# Patient Record
Sex: Female | Born: 1980 | Race: White | Hispanic: No | Marital: Single | Smoking: Never smoker
Health system: Southern US, Community
[De-identification: ages and names within clinical notes are randomized; demographics above are authoritative.]

## PROBLEM LIST (undated history)

## (undated) DIAGNOSIS — K219 Gastro-esophageal reflux disease without esophagitis: Secondary | ICD-10-CM

## (undated) DIAGNOSIS — A630 Anogenital (venereal) warts: Secondary | ICD-10-CM

## (undated) DIAGNOSIS — I499 Cardiac arrhythmia, unspecified: Secondary | ICD-10-CM

## (undated) DIAGNOSIS — E785 Hyperlipidemia, unspecified: Secondary | ICD-10-CM

## (undated) DIAGNOSIS — I10 Essential (primary) hypertension: Secondary | ICD-10-CM

## (undated) DIAGNOSIS — K589 Irritable bowel syndrome without diarrhea: Secondary | ICD-10-CM

## (undated) DIAGNOSIS — F419 Anxiety disorder, unspecified: Secondary | ICD-10-CM

## (undated) DIAGNOSIS — R112 Nausea with vomiting, unspecified: Secondary | ICD-10-CM

## (undated) DIAGNOSIS — Z9889 Other specified postprocedural states: Secondary | ICD-10-CM

## (undated) HISTORY — PX: APPENDECTOMY: SHX54

## (undated) HISTORY — DX: Anxiety disorder, unspecified: F41.9

## (undated) HISTORY — DX: Essential (primary) hypertension: I10

## (undated) HISTORY — DX: Anogenital (venereal) warts: A63.0

## (undated) HISTORY — DX: Hyperlipidemia, unspecified: E78.5

## (undated) HISTORY — PX: WISDOM TOOTH EXTRACTION: SHX21

---

## 2009-01-12 LAB — HM DIABETES FOOT EXAM: HM Diabetic Foot Exam: NORMAL

## 2011-01-24 ENCOUNTER — Encounter (HOSPITAL_COMMUNITY): Payer: Self-pay | Admitting: *Deleted

## 2011-01-24 ENCOUNTER — Emergency Department (HOSPITAL_COMMUNITY)
Admission: EM | Admit: 2011-01-24 | Discharge: 2011-01-24 | Disposition: A | Discharge status: Home / Self Care | Payer: 59 | Attending: Emergency Medicine | Admitting: Emergency Medicine

## 2011-01-24 ENCOUNTER — Emergency Department (HOSPITAL_COMMUNITY): Payer: 59

## 2011-01-24 DIAGNOSIS — R630 Anorexia: Secondary | ICD-10-CM | POA: Insufficient documentation

## 2011-01-24 DIAGNOSIS — R112 Nausea with vomiting, unspecified: Secondary | ICD-10-CM | POA: Insufficient documentation

## 2011-01-24 DIAGNOSIS — R1033 Periumbilical pain: Secondary | ICD-10-CM | POA: Insufficient documentation

## 2011-01-24 DIAGNOSIS — R197 Diarrhea, unspecified: Secondary | ICD-10-CM | POA: Insufficient documentation

## 2011-01-24 DIAGNOSIS — N949 Unspecified condition associated with female genital organs and menstrual cycle: Secondary | ICD-10-CM | POA: Insufficient documentation

## 2011-01-24 LAB — CBC
Platelets: 190 10*3/uL (ref 150–400)
RBC: 5.55 MIL/uL — ABNORMAL HIGH (ref 3.87–5.11)
RDW: 12.5 % (ref 11.5–15.5)
WBC: 10.7 10*3/uL — ABNORMAL HIGH (ref 4.0–10.5)

## 2011-01-24 LAB — URINALYSIS, ROUTINE W REFLEX MICROSCOPIC
Bilirubin Urine: NEGATIVE
Glucose, UA: NEGATIVE mg/dL
Hgb urine dipstick: NEGATIVE
Ketones, ur: NEGATIVE mg/dL
Leukocytes, UA: NEGATIVE
Nitrite: NEGATIVE
Urobilinogen, UA: 0.2 mg/dL (ref 0.0–1.0)

## 2011-01-24 LAB — DIFFERENTIAL
Basophils Absolute: 0 10*3/uL (ref 0.0–0.1)
Lymphocytes Relative: 14 % (ref 12–46)
Lymphs Abs: 1.4 10*3/uL (ref 0.7–4.0)
Neutro Abs: 8.5 10*3/uL — ABNORMAL HIGH (ref 1.7–7.7)

## 2011-01-24 LAB — COMPREHENSIVE METABOLIC PANEL
ALT: 11 U/L (ref 0–35)
AST: 14 U/L (ref 0–37)
Alkaline Phosphatase: 84 U/L (ref 39–117)
CO2: 24 mEq/L (ref 19–32)
Calcium: 9.1 mg/dL (ref 8.4–10.5)
Chloride: 107 mEq/L (ref 96–112)
GFR calc non Af Amer: 88 mL/min — ABNORMAL LOW (ref >90)
Potassium: 3.8 mEq/L (ref 3.5–5.1)
Sodium: 141 mEq/L (ref 135–145)
Total Bilirubin: 0.3 mg/dL (ref 0.3–1.2)

## 2011-01-24 MED ORDER — IOHEXOL 300 MG/ML  SOLN
100.0000 mL | Freq: Once | INTRAMUSCULAR | Status: AC | PRN
  Administered 2011-01-24: 100 mL via INTRAVENOUS

## 2011-01-24 MED ORDER — HYDROCODONE-ACETAMINOPHEN 5-325 MG PO TABS: 2.0000 | ORAL_TABLET | ORAL | Status: AC | PRN

## 2011-01-24 MED ORDER — IOHEXOL 300 MG/ML  SOLN
40.0000 mL | Freq: Once | INTRAMUSCULAR | Status: AC | PRN
  Administered 2011-01-24: 40 mL via ORAL

## 2011-01-24 MED ORDER — MORPHINE SULFATE 4 MG/ML IJ SOLN
4.0000 mg | Freq: Once | INTRAMUSCULAR | Status: AC
  Administered 2011-01-24: 4 mg via INTRAVENOUS
  Filled 2011-01-24: qty 1

## 2011-01-24 MED ORDER — ONDANSETRON HCL 4 MG/2ML IJ SOLN
4.0000 mg | Freq: Once | INTRAMUSCULAR | Status: AC
  Administered 2011-01-24: 4 mg via INTRAVENOUS

## 2011-01-24 MED ORDER — ONDANSETRON HCL 4 MG/2ML IJ SOLN
4.0000 mg | Freq: Once | INTRAMUSCULAR | Status: AC
  Administered 2011-01-24: 4 mg via INTRAVENOUS
  Filled 2011-01-24: qty 2

## 2011-01-24 MED ORDER — SODIUM CHLORIDE 0.9 % IV BOLUS (SEPSIS)
1000.0000 mL | Freq: Once | INTRAVENOUS | Status: AC
  Administered 2011-01-24: 1000 mL via INTRAVENOUS

## 2011-01-24 MED ORDER — PROMETHAZINE HCL 25 MG PO TABS: 25.0000 mg | ORAL_TABLET | Freq: Four times a day (QID) | ORAL | Status: DC | PRN

## 2011-01-24 MED ORDER — ONDANSETRON HCL 4 MG/2ML IJ SOLN
INTRAMUSCULAR | Status: AC
  Filled 2011-01-24: qty 2

## 2011-01-24 NOTE — ED Notes (Signed)
Pt A&O x4, follows commands, speaks in complete sentences, calm & cooperative, pt c/o nausea, pt rcvd 4mg  Zofran, pt encouraged to drink CT contrast, NAD

## 2011-01-24 NOTE — ED Notes (Signed)
Pt seen by EDPA prior to RN assessment, see PA notes, orders received and intiated.

## 2011-01-24 NOTE — ED Notes (Signed)
C/o abd pain, onset 0315, some nausea, "feel urge to have BM", also diarrhea 1 hr ago, with regular BM 20 minutes prior to that, (denies: vomiting, fever, bleeding, urinary or vaginal sx, dizziness or other sx). No meds PTA.

## 2011-01-24 NOTE — ED Notes (Signed)
Pt to ED c/o acute onset abd pain.  Pt has been experiencing cramping or diarrhea and a feeling of fullness every time after she eats since August.  Wed she exp more diarrhea than normal and she took immodium.  She had not had a bm since Wed and tonight she exp epigastric and lower abd intermittent cramping, accompanied by diarrhea with "pellets".  Pain increases if pt lays flat.  Denies fevers.

## 2011-01-24 NOTE — ED Notes (Signed)
Pt unable to provide stool sample.  No diarrhea noted since arrived to CDU.

## 2011-01-24 NOTE — ED Notes (Signed)
Medical screening examination/treatment/procedure(s) were conducted as a shared visit with non-physician practitioner(s) and myself.  I personally evaluated the patient during the encounter   HPI: 31 year old female with no prior abdominal surgery history presents with pain in the midabdomen. She describes it as a sharp and stabbing pain that comes on intermittently. Last for approximately 10 minutes and then resolve spontaneously. She had a diarrhea illness last week, took Imodium 3 days ago and had resolution of her diarrhea. She did not have another bowel movement until approximately 2 and half hours ago when she was awoken with midabdominal pain, had a large bulky bowel movement followed by very loose stools. The pain has not been relieved despite these actions. She denies dysuria, fever, vomiting but does have mild nausea.   Physical exam:  Patient appears in no acute distress, conjunctiva are clear, mucous membranes moist, neck is supple, no lymphadenopathy. Lungs are clear bilaterally, heart sounds are regular without murmurs. No tachycardia. Abdomen is soft, no tenderness in the right lower quadrant, mild midabdominal tenderness but no guarding, non-peritoneal.  Assessed:  Labs, imaging, reevaluate. Pain medication ordered   Change of shift - care signed out to Dr. Hyman Hopes at 8 AM  Vida Roller, MD 01/24/11 [phone]

## 2011-01-24 NOTE — ED Notes (Signed)
Pt appeared quite anxious while SL ws being placed.  After IV placed, pt stated she couldn't breath and she was feeling flushed.  PA Peter notified, pt placed on monitor and given instructions to deep breath.  VS stable.

## 2011-01-24 NOTE — ED Provider Notes (Signed)
  Physical Exam  BP 115/70  Pulse 93  Temp(Src) 97.4 F (36.3 C) (Oral)  Resp 18  SpO2 100%  LMP 01/16/2011  Physical Exam  ED Course  Procedures  Patient still having some mild pain around her umbilicus.  The motor to the CDU for further evaluation and care.     Carlyle Dolly, PA-C 01/24/11 1433

## 2011-01-24 NOTE — ED Provider Notes (Signed)
Patient report received from Ebbie Ridge, PA-C. 31 year old female presents ED with chief complaints of periumbilical abdominal pain with associated nausea vomiting and diarrhea. Patient has been evaluated. Her lab works unremarkable. Abdominal CT scan shows evidence of appendicolith but without evidence of appendicitis. Her pain has improved with pain medication. Patient has been moved to CDU for symptomatic treatment. Patient can be discharged if she is able to tolerate by mouth. Patient was given strict followup instructions.  12:34 PM Patient continues to endorse periumbilical abdominal pain. She has no appetite. She feels nauseated but states Zofran and morphine seems to make the pain worse. She just had a bout of diarrhea. We'll continue watchful waiting.  Questionable early onset appy.   2:01 PM Patient endorse improvement in her symptoms. The nausea has decreased. I instructed for patient to return to the ED in the next 24 hours if her symptoms are not resolving at home. We discussed the possibility of early onset appendicitis. If the symptoms improves, I will give patient referral to  followup outpatient for further management. Patient voiced understanding and agreement with plan.  Fayrene Helper, PA-C 01/24/11 1519  Fayrene Helper, PA-C 01/28/11 [phone]

## 2011-01-24 NOTE — ED Notes (Signed)
Pt s/s improved.   

## 2011-01-24 NOTE — ED Provider Notes (Signed)
History     CSN: [id number]  Arrival date & time 01/24/11  0455   None     Chief Complaint  Patient presents with  . Abdominal Pain     HPI  History provided by the patient. Patient is a 31 year old female with no significant past medical history who presents with complaints of acute onset generalized and periumbilical abdominal pains that began and awoke her from sleep around 3:00 this morning. Patient reports having some symptoms of diarrhea recently for which she took Imodium. Her symptoms had improved. Patient actually states that for the past several months she has noticed having loose stools after she eats anything. This morning after patient awoke with abdominal pain she does report having 1 large regular type bowel movement followed shortly after by a second loose watery diarrhea type bowel movement. She denies having any blood or mucus in her stool. Patient denies having any recent fever, chills, sweats or vomiting. She does have associated nausea. Patient recently finished normal menstrual period and denies any gynecological complaints. She denies any urinary complaints or symptoms. No urinary frequency, dysuria, hematuria. Patient has no significant past surgical history.    History reviewed. No pertinent past medical history.  Past Surgical History  Procedure Date  . Wisdom tooth extraction     Family History  Problem Relation Age of Onset  . Adopted: Yes    History  Substance Use Topics  . Smoking status: Never Smoker   . Smokeless tobacco: Not on file  . Alcohol Use: No    OB History    Grav Para Term Preterm Abortions TAB SAB Ect Mult Living                  Review of Systems  Constitutional: Negative for fever and chills.  Respiratory: Negative for cough and shortness of breath.   Cardiovascular: Negative for chest pain.  Gastrointestinal: Positive for nausea, abdominal pain and diarrhea. Negative for vomiting, constipation, blood in stool, abdominal  distention, anal bleeding and rectal pain.  Genitourinary: Positive for pelvic pain. Negative for dysuria, frequency, hematuria, flank pain, vaginal bleeding, vaginal discharge and menstrual problem.  All other systems reviewed and are negative.    Allergies  Review of patient's allergies indicates no known allergies.  Home Medications   Current Outpatient Rx  Name Route Sig Dispense Refill  . LOPERAMIDE HCL 2 MG PO CAPS Oral Take 2 mg by mouth once.      LMP 01/16/2011  Physical Exam  Nursing note and vitals reviewed. Constitutional: She is oriented to person, place, and time. She appears well-developed and well-nourished. No distress.  HENT:  Head: Normocephalic.  Neck: Normal range of motion. Neck supple.       No meningeal sign  Cardiovascular: Normal rate and regular rhythm.   No murmur heard. Pulmonary/Chest: Effort normal and breath sounds normal. No respiratory distress. She has no wheezes. She has no rales.  Abdominal: Soft. There is tenderness in the periumbilical area. There is rebound. There is no guarding, no CVA tenderness, no tenderness at McBurney's point and negative Murphy's sign.       Mild diffuse tenderness.  Neurological: She is alert and oriented to person, place, and time.  Skin: Skin is warm and dry. No rash noted.  Psychiatric: She has a normal mood and affect. Her behavior is normal.    ED Course  Procedures (including critical care time)   Labs Reviewed  CBC  DIFFERENTIAL  COMPREHENSIVE METABOLIC PANEL  LIPASE,  BLOOD  URINALYSIS, ROUTINE W REFLEX MICROSCOPIC  POCT PREGNANCY, URINE   Results for orders placed during the hospital encounter of 01/24/11  CBC      Component Value Range   WBC 10.7 (*) 4.0 - 10.5 (K/uL)   RBC 5.55 (*) 3.87 - 5.11 (MIL/uL)   Hemoglobin 15.1 (*) 12.0 - 15.0 (g/dL)   HCT 82.9  56.2 - 13.0 (%)   MCV 79.5  78.0 - 100.0 (fL)   MCH 27.2  26.0 - 34.0 (pg)   MCHC 34.2  30.0 - 36.0 (g/dL)   RDW 86.5  78.4 - 69.6 (%)    Platelets 190  150 - 400 (K/uL)  DIFFERENTIAL      Component Value Range   Neutrophils Relative 79 (*) 43 - 77 (%)   Neutro Abs 8.5 (*) 1.7 - 7.7 (K/uL)   Lymphocytes Relative 14  12 - 46 (%)   Lymphs Abs 1.4  0.7 - 4.0 (K/uL)   Monocytes Relative 7  3 - 12 (%)   Monocytes Absolute 0.7  0.1 - 1.0 (K/uL)   Eosinophils Relative 1  0 - 5 (%)   Eosinophils Absolute 0.1  0.0 - 0.7 (K/uL)   Basophils Relative 0  0 - 1 (%)   Basophils Absolute 0.0  0.0 - 0.1 (K/uL)  COMPREHENSIVE METABOLIC PANEL      Component Value Range   Sodium 141  135 - 145 (mEq/L)   Potassium 3.8  3.5 - 5.1 (mEq/L)   Chloride 107  96 - 112 (mEq/L)   CO2 24  19 - 32 (mEq/L)   Glucose, Bld 101 (*) 70 - 99 (mg/dL)   BUN 15  6 - 23 (mg/dL)   Creatinine, Ser 2.95  0.50 - 1.10 (mg/dL)   Calcium 9.1  8.4 - 28.4 (mg/dL)   Total Protein 7.6  6.0 - 8.3 (g/dL)   Albumin 3.9  3.5 - 5.2 (g/dL)   AST 14  0 - 37 (U/L)   ALT 11  0 - 35 (U/L)   Alkaline Phosphatase 84  39 - 117 (U/L)   Total Bilirubin 0.3  0.3 - 1.2 (mg/dL)   GFR calc non Af Amer 88 (*) >90 (mL/min)   GFR calc Af Amer >90  >90 (mL/min)  LIPASE, BLOOD      Component Value Range   Lipase 29  11 - 59 (U/L)  URINALYSIS, ROUTINE W REFLEX MICROSCOPIC      Component Value Range   Color, Urine YELLOW  YELLOW    APPearance CLOUDY (*) CLEAR    Specific Gravity, Urine 1.022  1.005 - 1.030    pH 5.5  5.0 - 8.0    Glucose, UA NEGATIVE  NEGATIVE (mg/dL)   Hgb urine dipstick NEGATIVE  NEGATIVE    Bilirubin Urine NEGATIVE  NEGATIVE    Ketones, ur NEGATIVE  NEGATIVE (mg/dL)   Protein, ur NEGATIVE  NEGATIVE (mg/dL)   Urobilinogen, UA 0.2  0.0 - 1.0 (mg/dL)   Nitrite NEGATIVE  NEGATIVE    Leukocytes, UA NEGATIVE  NEGATIVE   POCT PREGNANCY, URINE      Component Value Range   Preg Test, Ur NEGATIVE      Ct Abdomen Pelvis W Contrast  01/24/2011  *RADIOLOGY REPORT*  Clinical Data: Umbilical pain since 3:00 a.m. this morning. Diarrhea.  CT ABDOMEN AND PELVIS WITH  CONTRAST  Technique:  Multidetector CT imaging of the abdomen and pelvis was performed following the standard protocol during bolus administration of intravenous contrast.  Contrast: OMNIPAQUE IOHEXOL 300 MG/ML IV SOLN  Comparison: None.  Findings: There is a 9 mm appendicolith within the mid aspect of the appendix.  The rest of the appendix is normal in caliber containing air and a thin wall.  No periappendiceal inflammatory changes are seen. There is no CT evidence of acute appendicitis.  The bowel is unremarkable.  There is no bowel wall thickening or inflammatory changes.  Clear lung bases.  The heart is normal in size.  The liver, spleen, gallbladder and pancreas are unremarkable.  There is no bile duct dilation.  There are no adrenal masses.  The right kidney is under- rotated and a tiny presumed cyst arises from its medial mid to lower pole.  The kidneys are otherwise unremarkable.  Normal ureters.  Normal bladder.  Normal uterus and adnexa.  Trace, presumed physiologic, free fluid is noted in the cul-de-sac.  No adenopathy.  The bony structures are unremarkable.  IMPRESSION: No evidence of appendicitis although there is a 9 mm mid appendix appendicolith.  The exam is otherwise unremarkable.  No bowel inflammatory changes are evident.  Original Report Authenticated By: Domenic Moras, M.D.     1. Abdominal pain   2. Nausea, vomiting and diarrhea       MDM  5:00 AM patient seen and evaluated. Patient in no acute distress. Patient does appear slightly uncomfortable. Initial workup started with labs urinalysis and CT scan to evaluate for possible appendicitis versus colitis.  6:00AM  Pt discussed in signout with Ebbie Ridge PAC.  He will await labs and x-ray to make dispo.      Angus Seller, [place] 01/24/11 2004

## 2011-01-25 NOTE — ED Provider Notes (Signed)
Medical screening examination/treatment/procedure(s) were conducted as a shared visit with non-physician practitioner(s) and myself.  I personally evaluated the patient during the encounter Medical screening examination/treatment/procedure(s) were conducted as a shared visit with non-physician practitioner(s) and myself.  I personally evaluated the patient during the encounter  HPI: 31 year old female with no prior abdominal surgery history presents with pain in the midabdomen. She describes it as a sharp and stabbing pain that comes on intermittently. Last for approximately 10 minutes and then resolve spontaneously. She had a diarrhea illness last week, took Imodium 3 days ago and had resolution of her diarrhea. She did not have another bowel movement until approximately 2 and half hours ago when she was awoken with midabdominal pain, had a large bulky bowel movement followed by very loose stools. The pain has not been relieved despite these actions. She denies dysuria, fever, vomiting but does have mild nausea.   Physical exam: Patient appears in no acute distress, conjunctiva are clear, mucous membranes moist, neck is supple, no lymphadenopathy. Lungs are clear bilaterally, heart sounds are regular without murmurs. No tachycardia. Abdomen is soft, no tenderness in the right lower quadrant, mild midabdominal tenderness but no guarding, non-peritoneal.   Assessed: Labs, imaging, reevaluate. Pain medication ordered   See separate documentation as well   Vida Roller, MD 01/25/11 [phone]

## 2011-01-25 NOTE — ED Provider Notes (Signed)
Medical screening examination/treatment/procedure(s) were conducted as a shared visit with non-physician practitioner(s) and myself.  I personally evaluated the patient during the encounter  Medical screening examination/treatment/procedure(s) were conducted as a shared visit with non-physician practitioner(s) and myself. I personally evaluated the patient during the encounter   HPI: 31 year old female with no prior abdominal surgery history presents with pain in the midabdomen. She describes it as a sharp and stabbing pain that comes on intermittently. Last for approximately 10 minutes and then resolve spontaneously. She had a diarrhea illness last week, took Imodium 3 days ago and had resolution of her diarrhea. She did not have another bowel movement until approximately 2 and half hours ago when she was awoken with midabdominal pain, had a large bulky bowel movement followed by very loose stools. The pain has not been relieved despite these actions. She denies dysuria, fever, vomiting but does have mild nausea.   Physical exam: Patient appears in no acute distress, conjunctiva are clear, mucous membranes moist, neck is supple, no lymphadenopathy. Lungs are clear bilaterally, heart sounds are regular without murmurs. No tachycardia. Abdomen is soft, no tenderness in the right lower quadrant, mild midabdominal tenderness but no guarding, non-peritoneal.    Assessed: Labs, imaging, reevaluate. Pain medication ordered  Change of shift - care signed out to Dr. Hyman Hopes at 8 AM   Vida Roller, MD 01/25/11 [phone]

## 2011-02-02 NOTE — ED Provider Notes (Signed)
Medical screening examination/treatment/procedure(s) were performed by non-physician practitioner and as supervising physician I was immediately available for consultation/collaboration.   Vida Roller, MD 02/02/11 [phone]

## 2011-03-02 ENCOUNTER — Emergency Department (HOSPITAL_COMMUNITY)
Admission: EM | Admit: 2011-03-02 | Discharge: 2011-03-03 | Disposition: A | Discharge status: Home / Self Care | Payer: 59 | Attending: Emergency Medicine | Admitting: Emergency Medicine

## 2011-03-02 ENCOUNTER — Encounter (HOSPITAL_COMMUNITY): Payer: Self-pay | Admitting: Emergency Medicine

## 2011-03-02 DIAGNOSIS — D72829 Elevated white blood cell count, unspecified: Secondary | ICD-10-CM | POA: Insufficient documentation

## 2011-03-02 DIAGNOSIS — R112 Nausea with vomiting, unspecified: Secondary | ICD-10-CM | POA: Insufficient documentation

## 2011-03-02 DIAGNOSIS — R109 Unspecified abdominal pain: Secondary | ICD-10-CM | POA: Insufficient documentation

## 2011-03-02 LAB — COMPREHENSIVE METABOLIC PANEL
Albumin: 3.9 g/dL (ref 3.5–5.2)
Alkaline Phosphatase: 78 U/L (ref 39–117)
BUN: 9 mg/dL (ref 6–23)
Potassium: 3.8 mEq/L (ref 3.5–5.1)
Sodium: 136 mEq/L (ref 135–145)
Total Protein: 7.7 g/dL (ref 6.0–8.3)

## 2011-03-02 LAB — URINALYSIS, ROUTINE W REFLEX MICROSCOPIC
Glucose, UA: NEGATIVE mg/dL
Ketones, ur: 15 mg/dL — AB
Protein, ur: NEGATIVE mg/dL
Urobilinogen, UA: 0.2 mg/dL (ref 0.0–1.0)

## 2011-03-02 LAB — POCT PREGNANCY, URINE: Preg Test, Ur: NEGATIVE

## 2011-03-02 LAB — URINE MICROSCOPIC-ADD ON

## 2011-03-02 LAB — CBC
HCT: 45.1 % (ref 36.0–46.0)
Hemoglobin: 15.2 g/dL — ABNORMAL HIGH (ref 12.0–15.0)
MCH: 26.3 pg (ref 26.0–34.0)
MCV: 77.9 fL — ABNORMAL LOW (ref 78.0–100.0)
RBC: 5.79 MIL/uL — ABNORMAL HIGH (ref 3.87–5.11)

## 2011-03-02 LAB — DIFFERENTIAL
Basophils Absolute: 0 10*3/uL (ref 0.0–0.1)
Basophils Relative: 0 % (ref 0–1)
Eosinophils Absolute: 0 10*3/uL (ref 0.0–0.7)
Eosinophils Relative: 0 % (ref 0–5)
Metamyelocytes Relative: 0 %
Myelocytes: 0 %
Neutro Abs: 14.6 10*3/uL — ABNORMAL HIGH (ref 1.7–7.7)

## 2011-03-02 LAB — LIPASE, BLOOD: Lipase: 23 U/L (ref 11–59)

## 2011-03-02 MED ORDER — ONDANSETRON 4 MG PO TBDP: 8.0000 mg | ORAL_TABLET | Freq: Once | ORAL | Status: DC

## 2011-03-02 MED ORDER — PANTOPRAZOLE SODIUM 40 MG IV SOLR
40.0000 mg | Freq: Once | INTRAVENOUS | Status: DC
  Filled 2011-03-02: qty 40

## 2011-03-02 MED ORDER — ONDANSETRON HCL 8 MG PO TABS
ORAL_TABLET | ORAL | Status: AC
  Administered 2011-03-02: 8 mg
  Filled 2011-03-02: qty 1

## 2011-03-02 MED ORDER — METOCLOPRAMIDE HCL 5 MG/ML IJ SOLN
10.0000 mg | Freq: Once | INTRAMUSCULAR | Status: DC
  Filled 2011-03-02: qty 2

## 2011-03-02 NOTE — ED Notes (Signed)
Patient presents with c/o mid abd pain, lower back pain that started earlier today.  Stated she was here for the same thing about 1 month ago and was dx with N/V.  Currently on her period.  Has a history of diarrhea and constipation.

## 2011-03-02 NOTE — ED Notes (Signed)
Pt c/o mid abd pain radiating to lower back, onset this am with nausea and vomiting.

## 2011-03-02 NOTE — ED Provider Notes (Signed)
History     CSN: [id number]  Arrival date & time 03/02/11  1840   First MD Initiated Contact with Patient 03/02/11 2017      Chief Complaint  Patient presents with  . Abdominal Pain    (Consider location/radiation/quality/duration/timing/severity/associated sxs/prior treatment) Patient is a 31 y.o. [name] presenting with abdominal pain. The history is provided by the patient.  Abdominal Pain The primary symptoms of the illness include abdominal pain, nausea, vomiting and diarrhea. The primary symptoms of the illness do not include fever, hematochezia or dysuria. Episode onset: She has had intermittent aabdominal pain for the past one month.  The pain came on gradually. The abdominal pain is located in the periumbilical region. The abdominal pain does not radiate. The abdominal pain is relieved by nothing. The abdominal pain is exacerbated by eating.  The diarrhea is semi-solid. The diarrhea occurs 2 to 4 times per day.  Symptoms associated with the illness do not include chills. Associated symptoms comments: She was seen over one month ago here and reports a negative CT scan. She returns with persistent symptoms. .    History reviewed. No pertinent past medical history.  Past Surgical History  Procedure Date  . Wisdom tooth extraction     Family History  Problem Relation Age of Onset  . Adopted: Yes    History  Substance Use Topics  . Smoking status: Never Smoker   . Smokeless tobacco: Not on file  . Alcohol Use: No    OB History    Grav Para Term Preterm Abortions TAB SAB Ect Mult Living                  Review of Systems  Constitutional: Negative for fever and chills.  HENT: Negative.   Respiratory: Negative.   Cardiovascular: Negative.   Gastrointestinal: Positive for nausea, vomiting, abdominal pain and diarrhea. Negative for hematochezia.  Genitourinary: Negative for dysuria.  Musculoskeletal: Negative.   Skin: Negative.   Neurological: Negative.      Allergies  Vicodin  Home Medications   Current Outpatient Rx  Name Route Sig Dispense Refill  . ACETAMINOPHEN 500 MG PO TABS Oral Take 500 mg by mouth daily as needed. For pain    . DICYCLOMINE HCL 20 MG PO TABS Oral Take 20 mg by mouth 4 (four) times daily as needed. For abdominal cramping    . DOCUSATE SODIUM 100 MG PO CAPS Oral Take 100 mg by mouth daily.    Marland Kitchen LOPERAMIDE HCL 1 MG/5ML PO LIQD Oral Take 6 mg by mouth daily as needed.    Marland Kitchen PROMETHAZINE HCL 25 MG PO TABS Oral Take 25 mg by mouth daily as needed. For nausea      BP 122/74  Pulse 59  Temp(Src) Kristin.8 F (36.6 C) (Oral)  Resp 16  SpO2 98%  LMP 03/02/2011  Physical Exam  Constitutional: She appears well-developed and well-nourished.  HENT:  Head: Normocephalic.  Neck: Normal range of motion. Neck supple.  Cardiovascular: Normal rate and regular rhythm.   Pulmonary/Chest: Effort normal and breath sounds normal.  Abdominal: Soft. Bowel sounds are normal. There is tenderness. There is no rebound and no guarding.       Mild periumbilical tenderness without guarding or rebound.  Musculoskeletal: Normal range of motion.  Neurological: She is alert. No cranial nerve deficit.  Skin: Skin is warm and dry. No rash noted.  Psychiatric: She has a normal mood and affect.    ED Course  Procedures (including critical care  time)  Labs Reviewed  CBC - Abnormal; Notable for the following:    RBC 5.79 (*)    Hemoglobin 15.2 (*)    MCV 77.9 (*)    All other components within normal limits  COMPREHENSIVE METABOLIC PANEL - Abnormal; Notable for the following:    Glucose, Bld 102 (*)    All other components within normal limits  DIFFERENTIAL  LIPASE, BLOOD  URINALYSIS, ROUTINE W REFLEX MICROSCOPIC   No results found.   No diagnosis found.    MDM  Review CT scan 01-25-11 - negative. Patient with continued abdominal pain and now leukocytosis. Will obtain ultrasound to evaluate gall bladder.         Rodena Medin, PA-C 03/03/11 [phone]

## 2011-03-03 ENCOUNTER — Emergency Department (HOSPITAL_COMMUNITY): Payer: 59

## 2011-03-03 MED ORDER — OXYCODONE-ACETAMINOPHEN 5-325 MG PO TABS: 1.0000 | ORAL_TABLET | Freq: Four times a day (QID) | ORAL | Status: AC | PRN

## 2011-03-03 NOTE — ED Provider Notes (Signed)
Results reviewed. The patient has a leukocytosis. CT performed on 01/25/11 reviewed. Unremarkable. Ultrasound was negative for acute process. Patient states she's feeling much better and wishes to be discharged home. I explained that she continues to have symptoms in the next couple of days with worsening she is to return for further evaluation. I also instructed her to followup with her primary care physician this week. She is provided a prescription for pain medication. On reassessment her abdominal examination was unremarkable therefore do not feel a repeat CT scan is indicated at this time  Dayton Bailiff, MD 03/03/11 [phone]

## 2011-03-03 NOTE — Discharge Instructions (Signed)

## 2011-03-04 NOTE — ED Provider Notes (Signed)
Medical screening examination/treatment/procedure(s) were performed by non-physician practitioner and as supervising physician I was immediately available for consultation/collaboration.  Ethelda Chick, MD 03/04/11 1120

## 2011-03-06 ENCOUNTER — Other Ambulatory Visit (HOSPITAL_COMMUNITY): Payer: Self-pay | Admitting: Internal Medicine

## 2011-03-06 DIAGNOSIS — R1011 Right upper quadrant pain: Secondary | ICD-10-CM

## 2011-03-16 ENCOUNTER — Encounter (HOSPITAL_COMMUNITY)
Admission: RE | Admit: 2011-03-16 | Discharge: 2011-03-16 | Disposition: A | Discharge status: Home / Self Care | Payer: 59 | Source: Ambulatory Visit | Attending: Internal Medicine | Admitting: Internal Medicine

## 2011-03-16 DIAGNOSIS — R1011 Right upper quadrant pain: Secondary | ICD-10-CM | POA: Insufficient documentation

## 2011-03-16 MED ORDER — SINCALIDE 5 MCG IJ SOLR
INTRAMUSCULAR | Status: AC
  Administered 2011-03-16: 1.7 ug
  Filled 2011-03-16: qty 5

## 2011-03-16 MED ORDER — TECHNETIUM TC 99M MEBROFENIN IV KIT
5.0000 | PACK | Freq: Once | INTRAVENOUS | Status: AC | PRN
  Administered 2011-03-16: 5 via INTRAVENOUS

## 2011-07-10 ENCOUNTER — Emergency Department (HOSPITAL_COMMUNITY)
Admission: EM | Admit: 2011-07-10 | Discharge: 2011-07-10 | Disposition: A | Discharge status: Home / Self Care | Payer: 59 | Source: Home / Self Care | Attending: Family Medicine | Admitting: Family Medicine

## 2011-07-10 ENCOUNTER — Encounter (HOSPITAL_COMMUNITY): Payer: Self-pay

## 2011-07-10 DIAGNOSIS — F411 Generalized anxiety disorder: Secondary | ICD-10-CM

## 2011-07-10 DIAGNOSIS — F419 Anxiety disorder, unspecified: Secondary | ICD-10-CM

## 2011-07-10 HISTORY — DX: Irritable bowel syndrome, unspecified: K58.9

## 2011-07-10 HISTORY — DX: Gastro-esophageal reflux disease without esophagitis: K21.9

## 2011-07-10 MED ORDER — HYDROXYZINE HCL 50 MG PO TABS: 50.0000 mg | ORAL_TABLET | Freq: Three times a day (TID) | ORAL | Status: DC | PRN

## 2011-07-10 NOTE — ED Provider Notes (Signed)
History     CSN: [id number]  Arrival date & time 07/10/11  1447   First MD Initiated Contact with Patient 07/10/11 1606      Chief Complaint  Patient presents with  . Chest Pain  . Breathing Problem    (Consider location/radiation/quality/duration/timing/severity/associated sxs/prior treatment) HPI Comments: 31 year old female with a history of irritable bowel syndrome, anxiety episodes in the past. Here complaining of episodes of difficulty breathing described as shallow fast breathing associated with dizziness and chest discomfort. Episodes occurring more frequently in the last 2 days. Denies pain radiation to the arms neck or jaw, no diaphoresis. Also patient reports having some flareup of her IBS symptoms characterized by nausea diarrhea and abdominal cramps. IBS symptoms improved with dicyclomine , probiotics, Phenergan and Imodium.  Patient reports she is currently stressed about work, house and money issues. Not sleeping well and feels worried all the time. Has a followup appointment with the [facility] to establish a primary care in the next few weeks. Has never taken antidepressants or anxiolytic medications in the past. Denies cough or congestion. No fever. No headaches or visual changes. Denies current chest pain, headache nausea or abdominal pain. Denies suicidal or homicidal ideation.    Past Medical History  Diagnosis Date  . IBS (irritable bowel syndrome)   . GERD (gastroesophageal reflux disease)     Past Surgical History  Procedure Date  . Wisdom tooth extraction     Family History  Problem Relation Age of Onset  . Adopted: Yes    History  Substance Use Topics  . Smoking status: Never Smoker   . Smokeless tobacco: Not on file  . Alcohol Use: No    OB History    Grav Para Term Preterm Abortions TAB SAB Ect Mult Living                  Review of Systems  Constitutional: Negative for fever, chills, diaphoresis, appetite change, fatigue and unexpected  weight change.  HENT: Negative for congestion, sore throat, trouble swallowing and neck pain.   Respiratory: Positive for chest tightness. Negative for cough, wheezing and stridor.        As per HPI  Cardiovascular: Positive for chest pain. Negative for palpitations and leg swelling.  Gastrointestinal: Positive for nausea and diarrhea. Negative for vomiting.  Genitourinary: Negative for dysuria and frequency.  Skin: Negative for rash.  Neurological: Positive for dizziness. Negative for tremors, seizures, syncope, weakness and headaches.  Psychiatric/Behavioral: Positive for disturbed wake/sleep cycle. Negative for suicidal ideas, hallucinations, confusion, self-injury, dysphoric mood and agitation. The patient is nervous/anxious. The patient is not hyperactive.     Allergies  Morphine and related and Vicodin  Home Medications   Current Outpatient Rx  Name Route Sig Dispense Refill  . MULTIVITAMIN PO Oral Take by mouth.    Marland Kitchen PROBIOTIC DAILY PO Oral Take by mouth.    . BENEFIBER PO Oral Take by mouth.    . ACETAMINOPHEN 500 MG PO TABS Oral Take 500 mg by mouth daily as needed. For pain    . DICYCLOMINE HCL 20 MG PO TABS Oral Take 20 mg by mouth 4 (four) times daily as needed. For abdominal cramping    . DOCUSATE SODIUM 100 MG PO CAPS Oral Take 100 mg by mouth daily.    Marland Kitchen HYDROXYZINE HCL 50 MG PO TABS Oral Take 1 tablet (50 mg total) by mouth every 8 (eight) hours as needed for anxiety. 30 tablet 0  . LOPERAMIDE HCL 1  MG/5ML PO LIQD Oral Take 6 mg by mouth daily as needed.    Marland Kitchen PROMETHAZINE HCL 25 MG PO TABS Oral Take 1 tablet (25 mg total) by mouth every 6 (six) hours as needed for nausea. 20 tablet 0  . PROMETHAZINE HCL 25 MG PO TABS Oral Take 25 mg by mouth daily as needed. For nausea      BP 149/79  Pulse 66  Temp 98.1 F (36.7 C) (Oral)  Resp 16  SpO2 100%  LMP 07/03/2011  Physical Exam  Nursing note and vitals reviewed. Constitutional: She is oriented to person, place, and  time. She appears well-developed and well-nourished. No distress.  HENT:  Head: Normocephalic and atraumatic.  Mouth/Throat: No oropharyngeal exudate.  Eyes: Conjunctivae and EOM are normal. Pupils are equal, round, and reactive to light. No scleral icterus.  Neck: Normal range of motion. Neck supple. No JVD present.       Thyroid impress full.  Cardiovascular: Normal rate, regular rhythm, normal heart sounds and intact distal pulses.  Exam reveals no gallop and no friction rub.   No murmur heard. Pulmonary/Chest: Effort normal and breath sounds normal. No respiratory distress. She has no wheezes. She has no rales. She exhibits no tenderness.  Lymphadenopathy:    She has no cervical adenopathy.  Neurological: She is alert and oriented to person, place, and time.  Skin: No rash noted. She is not diaphoretic.  Psychiatric: She has a normal mood and affect. Her behavior is normal. Judgment and thought content normal.    ED Course  Procedures (including critical care time)  Labs Reviewed - No data to display No results found.   1. Anxiety       MDM  EKG: normal sinus rhythm. Rate 60's. Impress few isolated PAC's. No fascicular, BBB or AV blocks. No ST or other acute ischemic changes.  H/o anxiety and panic attacks in the past. Prescribed hydroxyzine, discussed with patient stress relieving strategies including daily walk brakes, breathing exercises and bed side note book to purge worries and develop next day to do list before sleep time. Patient has a new patient appointment soon at PCP office. Reccommended to discuss symptoms and possible thyroid work up, anxiety treatment and cardiology and/or behavioral health referral. Patient agreeable.         Sharin Grave, MD 07/11/11 1316

## 2011-07-10 NOTE — ED Notes (Signed)
Pt states she has been having chest tightness and shallow breathing for 2 days- states it may be stress.  States Sat thru Monday she had diarrhea and felt lightheaded and dizzy on Tues and Wednesday.

## 2011-07-10 NOTE — Discharge Instructions (Signed)
Your EKG (electrical heart activity test) does not show any signs of heart attack and is essentially normal other than few early beats as we discussed does not necessary constitute disease. You can talk about this test results with your primary care provider and discuss the need for any further cardiology evaluation as outpatient. You could also benefit from checking your thyroid function with your primary care doctor. Take the prescribed medications as instructed. Go to the emergency department if persistent or worsening symptoms despite following treatment. Otherwise followup with your primary care provider as as scheduled.

## 2011-07-17 ENCOUNTER — Ambulatory Visit: Payer: 59 | Admitting: Internal Medicine

## 2011-07-20 ENCOUNTER — Ambulatory Visit (INDEPENDENT_AMBULATORY_CARE_PROVIDER_SITE_OTHER): Payer: 59 | Admitting: Internal Medicine

## 2011-07-20 ENCOUNTER — Encounter: Payer: Self-pay | Admitting: Internal Medicine

## 2011-07-20 VITALS — BP 130/82 | HR 69 | Temp 97.9°F | Ht 65.0 in | Wt 188.1 lb

## 2011-07-20 DIAGNOSIS — F419 Anxiety disorder, unspecified: Secondary | ICD-10-CM

## 2011-07-20 DIAGNOSIS — F411 Generalized anxiety disorder: Secondary | ICD-10-CM

## 2011-07-20 DIAGNOSIS — Z0000 Encounter for general adult medical examination without abnormal findings: Secondary | ICD-10-CM

## 2011-07-20 DIAGNOSIS — K219 Gastro-esophageal reflux disease without esophagitis: Secondary | ICD-10-CM | POA: Insufficient documentation

## 2011-07-20 DIAGNOSIS — K589 Irritable bowel syndrome without diarrhea: Secondary | ICD-10-CM | POA: Insufficient documentation

## 2011-07-20 MED ORDER — ALPRAZOLAM 0.5 MG PO TABS
0.5000 mg | ORAL_TABLET | Freq: Three times a day (TID) | ORAL | Status: AC | PRN
Start: 2011-07-20 — End: 2011-08-19

## 2011-07-20 MED ORDER — RANITIDINE HCL 150 MG PO TABS: 150.0000 mg | ORAL_TABLET | Freq: Two times a day (BID) | ORAL | Status: DC

## 2011-07-20 NOTE — [facility] Notes (Signed)
  Subjective:    Patient ID: Kristin [name], female    DOB: [birth year], 31 y.o.   MRN: [id number]  HPI New pt to me and our practice, here to establish care patient is here today for annual physical. Patient feels well overall.  complains of anxiety and IBS-D Long hx same Worse with stress Not significantly improved with antihistamine (sedation) or probiotics/fiber   Past Medical History  Diagnosis Date  . IBS (irritable bowel syndrome)     diarrhea prone  . GERD (gastroesophageal reflux disease)   . Anxiety    Family History  Problem Relation Age of Onset  . Adopted: Yes  . Family history unknown: Yes   History  Substance Use Topics  . Smoking status: Never Smoker   . Smokeless tobacco: Not on file  . Alcohol Use: No    Review of Systems Constitutional: Negative for fever or weight change.  Respiratory: Negative for cough and shortness of breath.   Cardiovascular: Negative for chest pain or palpitations.  Gastrointestinal: Negative for abdominal pain, no bowel changes. episodic nausea and belching Musculoskeletal: Negative for gait problem or joint swelling.  Skin: Negative for rash.  Neurological: Negative for dizziness or headache.  No other specific complaints in a complete review of systems (except as listed in HPI above).     Objective:   Physical Exam BP 130/82  Pulse 69  Temp 97.9 F (36.6 C) (Oral)  Ht 5\' 5"  (1.651 m)  Wt 188 lb 1.9 oz (85.331 kg)  BMI 31.30 kg/m2  SpO2 98%  LMP 07/03/2011 Wt Readings from Last 3 Encounters:  07/20/11 188 lb 1.9 oz (85.331 kg)  03/16/11 187 lb (84.823 kg)   Constitutional: She is overweight, but appears well-developed and well-nourished. No distress.  HENT: Head: Normocephalic and atraumatic. Ears: B TMs ok, no erythema or effusion; Nose: Nose normal. Mouth/Throat: Oropharynx is clear and moist. No oropharyngeal exudate.  Eyes: Conjunctivae and EOM are normal. Pupils are equal, round, and reactive to light. No  scleral icterus.  Neck: Normal range of motion. Neck supple. No JVD or LAD present. No thyromegaly present.  Cardiovascular: Normal rate, regular rhythm and normal heart sounds.  No murmur heard. No BLE edema. Pulmonary/Chest: Effort normal and breath sounds normal. No respiratory distress. She has no wheezes.  Abdominal: Soft. Bowel sounds are normal. She exhibits no distension. There is no tenderness. no masses Musculoskeletal: Normal range of motion, no joint effusions. No gross deformities Neurological: She is alert and oriented to person, place, and time. No cranial nerve deficit. Coordination normal.  Skin: Skin is warm and dry. No rash noted. No erythema.  Psychiatric: She has an anxious mood and affect. Her behavior is normal. Judgment and thought content normal.   Lab Results  Component Value Date   WBC 17.2* 03/02/2011   HGB 15.2* 03/02/2011   HCT 45.1 03/02/2011   PLT 171 03/02/2011   GLUCOSE 102* 03/02/2011   ALT 11 03/02/2011   AST 16 03/02/2011   NA 136 03/02/2011   K 3.8 03/02/2011   CL 103 03/02/2011   CREATININE 0.71 03/02/2011   BUN 9 03/02/2011   CO2 20 03/02/2011       Assessment & Plan:  CPX/v70.0 - Patient has been counseled on age-appropriate routine health concerns for screening and prevention. These are reviewed and up-to-date. Immunizations are up-to-date or declined. Labs ordered and recent ECG reviewed.

## 2011-07-20 NOTE — Assessment & Plan Note (Signed)
Long hx same Overlap with panic attacks and IBS-D spells Reviewed possible benefit zoloft or paxil Will start with low dose prn xanax for panic attacks Pt to call if frequent use or unimproved symptoms

## 2011-07-20 NOTE — Assessment & Plan Note (Signed)
PPI caused D Try H2B BID x2 weeks, then as needed

## 2011-07-20 NOTE — Patient Instructions (Signed)
It was good to see you today. Health Maintenance reviewed - all recommended immunizations and age-appropriate screenings are up-to-date. Test(s) ordered today. Return when you are fasting -Your results will be called to you after review (48-72hours after test completion). If any changes need to be made, you will be notified at that time. Use Zanatc as discussed for reflux and xanax as needed for anxiety symptoms - Your prescription(s) have been submitted to your pharmacy. Please take as directed and contact our office if you believe you are having problem(s) with the medication(s). Other Medications reviewed, no changes at this time. Work on lifestyle changes as discussed (low fat, low carb, increased protein diet; improved exercise efforts; weight loss) to control sugar, blood pressure and cholesterol levels and/or reduce risk of developing other medical problems. Look into [email or web address] or other type of food journal to assist you in this process. Please schedule followup in 12 months for physical and labs, call sooner if problems.

## 2011-07-21 ENCOUNTER — Other Ambulatory Visit (INDEPENDENT_AMBULATORY_CARE_PROVIDER_SITE_OTHER): Payer: 59

## 2011-07-21 DIAGNOSIS — Z0000 Encounter for general adult medical examination without abnormal findings: Secondary | ICD-10-CM

## 2011-07-21 LAB — URINALYSIS, ROUTINE W REFLEX MICROSCOPIC
Bilirubin Urine: NEGATIVE
Hgb urine dipstick: NEGATIVE
Ketones, ur: NEGATIVE
Leukocytes, UA: NEGATIVE
Nitrite: NEGATIVE
Total Protein, Urine: NEGATIVE

## 2011-07-21 LAB — CBC WITH DIFFERENTIAL/PLATELET
Eosinophils Absolute: 0.1 10*3/uL (ref 0.0–0.7)
HCT: 43.2 % (ref 36.0–46.0)
Lymphs Abs: 2.3 10*3/uL (ref 0.7–4.0)
MCHC: 33 g/dL (ref 30.0–36.0)
MCV: 79.5 fl (ref 78.0–100.0)
Monocytes Absolute: 0.6 10*3/uL (ref 0.1–1.0)
Neutrophils Relative %: 57.7 % (ref 43.0–77.0)
Platelets: 181 10*3/uL (ref 150.0–400.0)

## 2011-07-21 LAB — BASIC METABOLIC PANEL
BUN: 10 mg/dL (ref 6–23)
CO2: 28 mEq/L (ref 19–32)
Chloride: 105 mEq/L (ref 96–112)
Creatinine, Ser: 1 mg/dL (ref 0.4–1.2)
Glucose, Bld: 99 mg/dL (ref 70–99)

## 2011-07-21 LAB — TSH: TSH: 1.89 u[IU]/mL (ref 0.35–5.50)

## 2011-07-21 LAB — LIPID PANEL
Total CHOL/HDL Ratio: 5
VLDL: 40.2 mg/dL — ABNORMAL HIGH (ref 0.0–40.0)

## 2011-07-21 LAB — HEPATIC FUNCTION PANEL
Alkaline Phosphatase: 67 U/L (ref 39–117)
Bilirubin, Direct: 0 mg/dL (ref 0.0–0.3)
Total Bilirubin: 0.7 mg/dL (ref 0.3–1.2)

## 2011-07-22 LAB — LDL CHOLESTEROL, DIRECT: Direct LDL: 137 mg/dL

## 2011-08-16 ENCOUNTER — Emergency Department (HOSPITAL_COMMUNITY): Payer: 59

## 2011-08-16 ENCOUNTER — Emergency Department (HOSPITAL_COMMUNITY)
Admission: EM | Admit: 2011-08-16 | Discharge: 2011-08-16 | Disposition: A | Discharge status: Nursing Home (Includes ICF) | Payer: 59 | Source: Home / Self Care | Attending: Emergency Medicine | Admitting: Emergency Medicine

## 2011-08-16 ENCOUNTER — Emergency Department (INDEPENDENT_AMBULATORY_CARE_PROVIDER_SITE_OTHER): Payer: 59

## 2011-08-16 ENCOUNTER — Encounter (HOSPITAL_COMMUNITY): Payer: Self-pay | Admitting: Emergency Medicine

## 2011-08-16 ENCOUNTER — Emergency Department (HOSPITAL_COMMUNITY)
Admission: EM | Admit: 2011-08-16 | Discharge: 2011-08-16 | Disposition: A | Discharge status: Home / Self Care | Payer: 59 | Attending: Emergency Medicine | Admitting: Emergency Medicine

## 2011-08-16 ENCOUNTER — Other Ambulatory Visit: Payer: Self-pay

## 2011-08-16 ENCOUNTER — Encounter (HOSPITAL_COMMUNITY): Payer: Self-pay

## 2011-08-16 DIAGNOSIS — R079 Chest pain, unspecified: Secondary | ICD-10-CM

## 2011-08-16 DIAGNOSIS — R0602 Shortness of breath: Secondary | ICD-10-CM

## 2011-08-16 DIAGNOSIS — K589 Irritable bowel syndrome without diarrhea: Secondary | ICD-10-CM | POA: Insufficient documentation

## 2011-08-16 DIAGNOSIS — K219 Gastro-esophageal reflux disease without esophagitis: Secondary | ICD-10-CM | POA: Insufficient documentation

## 2011-08-16 DIAGNOSIS — F411 Generalized anxiety disorder: Secondary | ICD-10-CM | POA: Insufficient documentation

## 2011-08-16 LAB — CBC WITH DIFFERENTIAL/PLATELET
Basophils Absolute: 0 10*3/uL (ref 0.0–0.1)
Eosinophils Relative: 1 % (ref 0–5)
HCT: 43.5 % (ref 36.0–46.0)
Lymphocytes Relative: 33 % (ref 12–46)
Lymphs Abs: 2.9 10*3/uL (ref 0.7–4.0)
MCV: 80 fL (ref 78.0–100.0)
Monocytes Absolute: 0.8 10*3/uL (ref 0.1–1.0)
Monocytes Relative: 9 % (ref 3–12)
RDW: 13.4 % (ref 11.5–15.5)
WBC: 8.8 10*3/uL (ref 4.0–10.5)

## 2011-08-16 LAB — BASIC METABOLIC PANEL
BUN: 12 mg/dL (ref 6–23)
CO2: 23 mEq/L (ref 19–32)
Chloride: 106 mEq/L (ref 96–112)
Glucose, Bld: 78 mg/dL (ref 70–99)
Potassium: 4.1 mEq/L (ref 3.5–5.1)

## 2011-08-16 MED ORDER — IOHEXOL 350 MG/ML SOLN
100.0000 mL | Freq: Once | INTRAVENOUS | Status: AC | PRN
  Administered 2011-08-16: 100 mL via INTRAVENOUS

## 2011-08-16 MED ORDER — SODIUM CHLORIDE 0.9 % IV SOLN: Freq: Once | INTRAVENOUS | Status: DC

## 2011-08-16 NOTE — ED Provider Notes (Signed)
Chief Complaint  Patient presents with  . Shortness of Breath    shortness of breath x 3 days.    History of Present Illness:   Kristin [name] is a 31 year old female who has had a four-day history of intermittent episodes of shortness of breath. The first occurred Thursday, 4 days ago while she was walking from the hospital to her car. She suddenly became very short of breath. This would get worse with any exertion. The episode lasted about 24 hours. She denies any coughing, wheezing, or chest pain at that time. Yesterday the same thing happened. She felt like a pressure in her chest. She tried Zyrtec and albuterol without relief. The shortness of breath then went away. Today she doesn't feel very short of breath but has pain that begins in her left shoulder blade and radiates around to her left submammary area. It is not pleuritic. She did not have the chest pain with either of the 2 previous episodes. She denies any fever, chills, URI symptoms, palpitations, dizziness, or syncope. She has no leg pain or swelling and no history of pulmonary embolus or DVT. She denies being nervous or anxious.  Review of Systems:  Other than noted above, the patient denies any of the following symptoms. Systemic:  No fever, chills, sweats, or fatigue. ENT:  No nasal congestion, rhinorrhea, or sore throat. Pulmonary:  No cough, wheezing, shortness of breath, sputum production, hemoptysis. Cardiac:  No palpitations, rapid heartbeat, dizziness, presyncope or syncope. GI:  No abdominal pain, heartburn, nausea, or vomiting. Skin:  No rash or itching. Ext:  No leg pain or swelling.   PMFSH:  Past medical history, family history, social history, meds, and allergies were reviewed and updated as needed.  Physical Exam:   Vital signs:  BP 109/73  Pulse 70  Temp 98.2 F (36.8 C) (Oral)  Resp 18  SpO2 99%  LMP 07/19/2011 Gen:  Alert, oriented, in no distress, skin warm and dry. Eye:  PERRL, lids and conjunctivas normal.   Sclera non-icteric. ENT:  Mucous membranes moist, pharynx clear. Neck:  Supple, no adenopathy or tenderness.  No JVD. Lungs:  Clear to auscultation, no wheezes, rales or rhonchi.  No respiratory distress. Heart:  Regular rhythm.  No gallops, murmers, clicks or rubs. Chest:  No chest wall tenderness. Abdomen:  Soft, nontender, no organomegaly or mass.  Bowel sounds normal.  No pulsatile abdominal mass or bruit. Ext:  No edema.  No calf tenderness and Homann's sign negative.  Pulses full and equal. Skin:  Warm and dry.  No rash.   Radiology:  Dg Chest 2 View  08/16/2011  *RADIOLOGY REPORT*  Clinical Data: Shortness of breath  CHEST - 2 VIEW  Comparison: None.  Findings: Lungs clear.  Heart size and pulmonary vascularity normal.  No effusion.  Visualized bones unremarkable.  IMPRESSION: No acute disease  Original Report Authenticated By: Thora Lance III, M.D.    EKG:   Date: 08/16/2011  Rate: 60  Rhythm: normal sinus rhythm and sinus arrhythmia  QRS Axis: normal  Intervals: normal  ST/T Wave abnormalities: normal  Conduction Disutrbances:none  Narrative Interpretation: Normal sinus rhythm with sinus arrhythmia, normal EKG.  Old EKG Reviewed: No change since previous EKG  Assessment:  The primary encounter diagnosis was Shortness of breath. A diagnosis of Chest pain was also pertinent to this visit. Differential diagnosis includes anxiety, asthma, or pulmonary embolus.   Plan:   1.  The following meds were prescribed:   New Prescriptions  No medications on file   2.  The patient was transported to the emergency department via shuttle.  Reuben Likes, MD 08/16/11 1345

## 2011-08-16 NOTE — ED Notes (Signed)
Pt c/o shortness of breath on and off for 3 days, denies cough or cold symptoms. States was taking zantac for acid reflux for 14 days but stopped. States sob feels like chest is heavy and has started having left lower rib pain one hr ago.

## 2011-08-16 NOTE — ED Provider Notes (Signed)
History     CSN: [id number]  Arrival date & time 08/16/11  1351   First MD Initiated Contact with Patient 08/16/11 1643      Chief Complaint  Patient presents with  . Shortness of Breath  . Chest Pain    left chest    (Consider location/radiation/quality/duration/timing/severity/associated sxs/prior treatment) Patient is a 31 y.o. female presenting with shortness of breath and chest pain. The history is provided by the patient.  Shortness of Breath  Associated symptoms include chest pain and shortness of breath.  Chest Pain Primary symptoms include shortness of breath.    patient here complaining of shortness of breath with pleuritic chest pain localized left-sided chest x4 days. Denies any leg pain or swelling. No hemoptysis. No fever or cough. No prior history of same. Denies use any birth control pills or recent travel history. Was seen in urgent care and sent here for a CAT scan of her chest to rule out pulmonary embolism  Past Medical History  Diagnosis Date  . IBS (irritable bowel syndrome)     diarrhea prone  . GERD (gastroesophageal reflux disease)   . Anxiety     Past Surgical History  Procedure Date  . Wisdom tooth extraction     Family History  Problem Relation Age of Onset  . Adopted: Yes    History  Substance Use Topics  . Smoking status: Never Smoker   . Smokeless tobacco: Not on file  . Alcohol Use: No    OB History    Grav Para Term Preterm Abortions TAB SAB Ect Mult Living                  Review of Systems  Respiratory: Positive for shortness of breath.   Cardiovascular: Positive for chest pain.  All other systems reviewed and are negative.    Allergies  Morphine and related and Vicodin  Home Medications   Current Outpatient Rx  Name Route Sig Dispense Refill  . ALBUTEROL SULFATE HFA 108 (90 BASE) MCG/ACT IN AERS Inhalation Inhale 2 puffs into the lungs every 6 (six) hours as needed.    . ALPRAZOLAM 0.5 MG PO TABS Oral Take 1  tablet (0.5 mg total) by mouth 3 (three) times daily as needed for sleep or anxiety. 30 tablet 0  . CETIRIZINE HCL 10 MG PO TABS Oral Take 5 mg by mouth once.       BP 128/76  Pulse 60  Temp 98.2 F (36.8 C) (Oral)  Resp 18  SpO2 100%  LMP 07/19/2011  Physical Exam  Nursing note and vitals reviewed. Constitutional: She is oriented to person, place, and time. She appears well-developed and well-nourished.  Non-toxic appearance. No distress.  HENT:  Head: Normocephalic and atraumatic.  Eyes: Conjunctivae, EOM and lids are normal. Pupils are equal, round, and reactive to light.  Neck: Normal range of motion. Neck supple. No tracheal deviation present. No mass present.  Cardiovascular: Normal rate, regular rhythm and normal heart sounds.  Exam reveals no gallop.   No murmur heard. Pulmonary/Chest: Effort normal and breath sounds normal. No stridor. No respiratory distress. She has no decreased breath sounds. She has no wheezes. She has no rhonchi. She has no rales.  Abdominal: Soft. Normal appearance and bowel sounds are normal. She exhibits no distension. There is no tenderness. There is no rebound and no CVA tenderness.  Musculoskeletal: Normal range of motion. She exhibits no edema and no tenderness.  Neurological: She is alert and oriented to person,  place, and time. She has normal strength. No cranial nerve deficit or sensory deficit. GCS eye subscore is 4. GCS verbal subscore is 5. GCS motor subscore is 6.  Skin: Skin is warm and dry. No abrasion and no rash noted.  Psychiatric: She has a normal mood and affect. Her speech is normal and behavior is normal.    ED Course  Procedures (including critical care time)  Labs Reviewed  CBC WITH DIFFERENTIAL - Abnormal; Notable for the following:    RBC 5.44 (*)     All other components within normal limits  POCT I-STAT TROPONIN I  BASIC METABOLIC PANEL   Dg Chest 2 View  08/16/2011  *RADIOLOGY REPORT*  Clinical Data: Shortness of  breath  CHEST - 2 VIEW  Comparison: None.  Findings: Lungs clear.  Heart size and pulmonary vascularity normal.  No effusion.  Visualized bones unremarkable.  IMPRESSION: No acute disease  Original Report Authenticated By: Thora Lance III, M.D.     1. Chest pain   2. Shortness of breath      ///////// CDU NOTE: Rhea Bleacher PA-C.   7:24 PM Handoff from Dr. Freida Busman. Patient with left-sided pleuritic CP with intermittent SOB -- presetns from [facility] pending CT angio to rule-out PE. No risk factors. Unknown family history.    Vital signs reviewed and are as follows: Filed Vitals:   08/16/11 1359  BP: 128/76  Pulse: 60  Temp: 98.2 F (36.8 C)  Resp: 18   Patient seen and examined.   7:25 PM Exam:  Gen NAD; Heart RRR, nml S1,S2, no m/r/g; Lungs CTAB; Abd soft, NT, no rebound or guarding; Ext 2+ pedal pulses bilaterally, no edema.  9:44 PM CT reviewed by myself. No PE per radiologist. No other acute findings. Patient informed of results. Urged PCP follow-up in next week. Re-exam: unchanged, no resp distress, no wheezing or SOB.   Patient counseled to return with worsening or changing symptoms.   Patient verbalizes understanding and agrees with plan.   MDM: CP with SOB. CTA of chest does not show PE or other acute findings. EKG performed earlier reviewed by myself and is normal. VSS. Patient appears well during her ED stay. She has PCP follow-up ////////   MDM  Pt sent to cdu to complete w/u        Renne Crigler, PA 08/16/11 2148

## 2011-08-16 NOTE — ED Notes (Signed)
Pt reports left sided chest pain onset today. Pt also reports shortness of breath x 2 days. Pt seen at urgent care and sent here for further eval. Skin w/d color good. Pt talking in complete sentences.

## 2011-08-16 NOTE — ED Notes (Signed)
Patient transported to CT 

## 2011-08-18 NOTE — ED Provider Notes (Signed)
Medical screening examination/treatment/procedure(s) were conducted as a shared visit with non-physician practitioner(s) and myself.  I personally evaluated the patient during the encounter  Toy Baker, MD 08/18/11 [phone]

## 2011-10-03 ENCOUNTER — Emergency Department (HOSPITAL_COMMUNITY)
Admission: EM | Admit: 2011-10-03 | Discharge: 2011-10-03 | Disposition: A | Discharge status: Nursing Home (Includes ICF) | Payer: 59 | Source: Home / Self Care | Attending: Emergency Medicine | Admitting: Emergency Medicine

## 2011-10-03 ENCOUNTER — Encounter (HOSPITAL_COMMUNITY): Payer: Self-pay | Admitting: Emergency Medicine

## 2011-10-03 ENCOUNTER — Encounter (HOSPITAL_COMMUNITY): Payer: Self-pay | Admitting: *Deleted

## 2011-10-03 ENCOUNTER — Inpatient Hospital Stay (HOSPITAL_COMMUNITY)
Admission: EM | Admit: 2011-10-03 | Discharge: 2011-10-06 | DRG: 343 | Disposition: A | Discharge status: Home / Self Care | Payer: 59 | Attending: Surgery | Admitting: Surgery

## 2011-10-03 DIAGNOSIS — K219 Gastro-esophageal reflux disease without esophagitis: Secondary | ICD-10-CM | POA: Diagnosis present

## 2011-10-03 DIAGNOSIS — Z79899 Other long term (current) drug therapy: Secondary | ICD-10-CM

## 2011-10-03 DIAGNOSIS — K589 Irritable bowel syndrome without diarrhea: Secondary | ICD-10-CM | POA: Diagnosis present

## 2011-10-03 DIAGNOSIS — K37 Unspecified appendicitis: Secondary | ICD-10-CM

## 2011-10-03 DIAGNOSIS — K3580 Unspecified acute appendicitis: Principal | ICD-10-CM | POA: Diagnosis present

## 2011-10-03 DIAGNOSIS — F411 Generalized anxiety disorder: Secondary | ICD-10-CM | POA: Diagnosis present

## 2011-10-03 LAB — COMPREHENSIVE METABOLIC PANEL
ALT: 10 U/L (ref 0–35)
Alkaline Phosphatase: 80 U/L (ref 39–117)
BUN: 8 mg/dL (ref 6–23)
CO2: 22 mEq/L (ref 19–32)
GFR calc Af Amer: >90 mL/min (ref >90)
GFR calc non Af Amer: >90 mL/min (ref >90)
Glucose, Bld: 107 mg/dL — ABNORMAL HIGH (ref 70–99)
Potassium: 3.6 mEq/L (ref 3.5–5.1)
Total Bilirubin: 0.2 mg/dL — ABNORMAL LOW (ref 0.3–1.2)
Total Protein: 7.6 g/dL (ref 6.0–8.3)

## 2011-10-03 LAB — CBC WITH DIFFERENTIAL/PLATELET
Eosinophils Absolute: 0.1 10*3/uL (ref 0.0–0.7)
Hemoglobin: 14 g/dL (ref 12.0–15.0)
Lymphocytes Relative: 15 % (ref 12–46)
Lymphs Abs: 2 10*3/uL (ref 0.7–4.0)
MCH: 26.9 pg (ref 26.0–34.0)
MCV: 79.3 fL (ref 78.0–100.0)
Monocytes Relative: 6 % (ref 3–12)
Neutrophils Relative %: 78 % — ABNORMAL HIGH (ref 43–77)
RBC: 5.21 MIL/uL — ABNORMAL HIGH (ref 3.87–5.11)
WBC: 12.7 10*3/uL — ABNORMAL HIGH (ref 4.0–10.5)

## 2011-10-03 LAB — URINALYSIS, ROUTINE W REFLEX MICROSCOPIC
Bilirubin Urine: NEGATIVE
Glucose, UA: NEGATIVE mg/dL
Ketones, ur: NEGATIVE mg/dL
Leukocytes, UA: NEGATIVE
Specific Gravity, Urine: 1.016 (ref 1.005–1.030)
pH: 8 (ref 5.0–8.0)

## 2011-10-03 LAB — POCT URINALYSIS DIP (DEVICE)
Ketones, ur: NEGATIVE mg/dL
Protein, ur: NEGATIVE mg/dL
Specific Gravity, Urine: 1.015 (ref 1.005–1.030)
pH: 7.5 (ref 5.0–8.0)

## 2011-10-03 LAB — LIPASE, BLOOD: Lipase: 32 U/L (ref 11–59)

## 2011-10-03 LAB — WET PREP, GENITAL
Clue Cells Wet Prep HPF POC: NONE SEEN
WBC, Wet Prep HPF POC: NONE SEEN

## 2011-10-03 MED ORDER — IOHEXOL 300 MG/ML  SOLN
20.0000 mL | INTRAMUSCULAR | Status: DC
  Administered 2011-10-03: 20 mL via ORAL

## 2011-10-03 MED ORDER — ONDANSETRON HCL 4 MG/2ML IJ SOLN
4.0000 mg | Freq: Once | INTRAMUSCULAR | Status: DC
  Filled 2011-10-03 (×2): qty 2

## 2011-10-03 MED ORDER — MORPHINE SULFATE 4 MG/ML IJ SOLN
4.0000 mg | Freq: Once | INTRAMUSCULAR | Status: DC
  Filled 2011-10-03: qty 1

## 2011-10-03 NOTE — ED Notes (Signed)
Pt. Refused pain medicine currently, states she will let this nurse know if needed in the future.

## 2011-10-03 NOTE — ED Notes (Signed)
Report to ed by marchella, cma

## 2011-10-03 NOTE — ED Provider Notes (Signed)
Chief Complaint  Patient presents with  . Abdominal Pain    right sided / center naval area    History of Present Illness:    Kristin [name] is a 31 year old female who presents with a 24-hour history of right lower quadrant abdominal pain which radiates towards the epigastrium. The pain began last night and is gradually getting worse, sometimes doubling her over. The pain occurs in waves and is described as sharp and cramping. It radiates through to her lower back and sometimes even into her chest. Severe episodes of pain are associated with nausea. At most the pain as a 10 over 10, right now is down to 6/10 in intensity. The pain is worse if she lies flat or urinates. She denies any burning with urination, frequency, urgency, or hematuria. Her appetite is normal. She's had no fever or vomiting. She has had some diarrhea today with 3-4 loose stools today without blood or mucus. She denies any GYN complaints. Her last menstrual period was September 10. She is sexually active but only with a single female partner.  Review of Systems:  Other than noted above, the patient denies any of the following symptoms: Constitutional:  No fever, chills, fatigue, weight loss or anorexia. Lungs:  No cough or shortness of breath. Heart:  No chest pain, palpitations, syncope or edema.  No cardiac history. Abdomen:  No nausea, vomiting, hematememesis, melena, diarrhea, or hematochezia. GU:  No dysuria, frequency, urgency, or hematuria. Gyn:  No vaginal discharge, itching, abnormal bleeding, dyspareunia, or pelvic pain.  PMFSH:  Past medical history, family history, social history, meds, and allergies were reviewed along with nurse's notes.  No prior abdominal surgeries, past history of GI problems, STDs or GYN problems.  No history of aspirin or NSAID use.  No excessive alcohol intake.  Physical Exam:   Vital signs:  BP 140/86  Pulse 90  Temp 99.3 F (37.4 C) (Oral)  Resp 18  SpO2 98%  LMP 09/29/2011 Gen:  Alert,  oriented, in no distress. Lungs:  Breath sounds clear and equal bilaterally.  No wheezes, rales or rhonchi. Heart:  Regular rhythm.  No gallops or murmers.   Abdomen:  Abdomen is soft, flat, nondistended. There is tenderness to palpation in the right lower quadrant with guarding and rebound. She has a positive Rosvig sign, positive obturator sign, and positive iliopsoas sign. No organomegaly or mass. Bowel sounds are normally active. Skin:  Clear, warm and dry.  No rash.  Labs:   Results for orders placed during the hospital encounter of 10/03/11  POCT URINALYSIS DIP (DEVICE)      Component Value Range   Glucose, UA NEGATIVE  NEGATIVE mg/dL   Bilirubin Urine NEGATIVE  NEGATIVE   Ketones, ur NEGATIVE  NEGATIVE mg/dL   Specific Gravity, Urine 1.015  1.005 - 1.030   Hgb urine dipstick NEGATIVE  NEGATIVE   pH 7.5  5.0 - 8.0   Protein, ur NEGATIVE  NEGATIVE mg/dL   Urobilinogen, UA 0.2  0.0 - 1.0 mg/dL   Nitrite NEGATIVE  NEGATIVE   Leukocytes, UA NEGATIVE  NEGATIVE    Assessment:  The encounter diagnosis was Appendicitis.  Plan:   1.  The following meds were prescribed:   New Prescriptions   No medications on file   2.  The patient was transferred to the emergency department via shuttle.  Reuben Likes, MD 10/03/11 2011

## 2011-10-03 NOTE — ED Notes (Signed)
IV team at bedside 

## 2011-10-03 NOTE — ED Provider Notes (Signed)
History     CSN: [id number]  Arrival date & time 10/03/11  2018   First MD Initiated Contact with Patient 10/03/11 2303      Chief Complaint  Patient presents with  . Abdominal Pain    (Consider location/radiation/quality/duration/timing/severity/associated sxs/prior treatment) HPI 31 year old female presents emergency department from urgent care sent over for evaluation of abdominal pain concerning for appendicitis. Patient reports onset of. Umbilical pain yesterday evening. Patient with history of IBS, thought it was secondary to gas. She reports taking a homeopathic medication to help with gas pains. She reports she had persistent pain that kept her from sleeping well overnight. Pain has persistently worsened throughout the day. She has not had fever or anorexia. She reports some loose stools, last bowel movement around 5 PM. LMP was September 10th. Patient reports palpation of her abdomen makes her right lower quadrant hurt. She has a fullness in this area. She denies any urinary symptoms, no vaginal discharge. Patient had CAT scan done in January for similar but worse pain, that time had appendicolith. She was also seen for similar pain in February.  Past Medical History  Diagnosis Date  . IBS (irritable bowel syndrome)     diarrhea prone  . GERD (gastroesophageal reflux disease)   . Anxiety     Past Surgical History  Procedure Date  . Wisdom tooth extraction     Family History  Problem Relation Age of Onset  . Adopted: Yes    History  Substance Use Topics  . Smoking status: Never Smoker   . Smokeless tobacco: Not on file  . Alcohol Use: No    OB History    Grav Para Term Preterm Abortions TAB SAB Ect Mult Living                  Review of Systems  All other systems reviewed and are negative.    Allergies  Morphine and related and Vicodin  Home Medications   Current Outpatient Rx  Name Route Sig Dispense Refill  . DICYCLOMINE HCL 10 MG PO CAPS Oral  Take 10 mg by mouth 4 (four) times daily -  before meals and at bedtime.    Marland Kitchen OVER THE COUNTER MEDICATION Oral Take 2 tablets by mouth every evening. HOMEOPATHIC GAS RELIEF    . PROBIOTIC DAILY PO Oral Take 1 tablet by mouth every morning. PRIMAL DEFENSE      BP 136/76  Pulse 82  Temp 98.1 F (36.7 C) (Oral)  Resp 13  SpO2 99%  LMP 09/29/2011  Physical Exam  Nursing note and vitals reviewed. Constitutional: She is oriented to person, place, and time. She appears well-developed and well-nourished. She appears distressed (Uncomfortable appearing).  HENT:  Head: Normocephalic and atraumatic.  Nose: Nose normal.  Mouth/Throat: Oropharynx is clear and moist.  Eyes: Conjunctivae normal and EOM are normal. Pupils are equal, round, and reactive to light.  Neck: Normal range of motion. Neck supple. No JVD present. No tracheal deviation present. No thyromegaly present.  Cardiovascular: Normal rate, regular rhythm, normal heart sounds and intact distal pulses.  Exam reveals no gallop and no friction rub.   No murmur heard. Pulmonary/Chest: Effort normal and breath sounds normal. No stridor. No respiratory distress. She has no wheezes. She has no rales. She exhibits no tenderness.  Abdominal: Soft. Bowel sounds are normal. She exhibits no distension and no mass. There is tenderness (tenderness in umbilicus with radiation into her right lower quadrant with palpation along with pain directly over  McBurney's point). There is rebound. There is no guarding.  Genitourinary: Vagina normal and uterus normal. No vaginal discharge found.       No adnexal pain or masses appreciated area no CMT  Musculoskeletal: Normal range of motion. She exhibits no edema and no tenderness.  Lymphadenopathy:    She has no cervical adenopathy.  Neurological: She is alert and oriented to person, place, and time. She exhibits normal muscle tone. Coordination normal.  Skin: Skin is warm and dry. No rash noted. No erythema. No  pallor.  Psychiatric: She has a normal mood and affect. Her behavior is normal. Judgment and thought content normal.    ED Course  Procedures (including critical care time)  Labs Reviewed  CBC WITH DIFFERENTIAL - Abnormal; Notable for the following:    WBC 12.7 (*)     RBC 5.21 (*)     Neutrophils Relative 78 (*)     Neutro Abs 9.9 (*)     All other components within normal limits  COMPREHENSIVE METABOLIC PANEL - Abnormal; Notable for the following:    Glucose, Bld 107 (*)     Total Bilirubin 0.2 (*)     All other components within normal limits  URINALYSIS, ROUTINE W REFLEX MICROSCOPIC  PREGNANCY, URINE  LIPASE, BLOOD  WET PREP, GENITAL  URINE CULTURE  GC/CHLAMYDIA PROBE AMP, GENITAL   Ct Abdomen Pelvis W Contrast  10/04/2011  *RADIOLOGY REPORT*  Clinical Data: Abdominal pain, history of IBS  CT ABDOMEN AND PELVIS WITH CONTRAST  Technique:  Multidetector CT imaging of the abdomen and pelvis was performed following the standard protocol during bolus administration of intravenous contrast.  Contrast: OMNIPAQUE IOHEXOL 300 MG/ML  SOLN  Comparison: 01/24/2011  Findings: Lung bases are clear.  Liver, spleen, pancreas, and adrenal glands within normal limits.  Gallbladder is unremarkable.  No intrahepatic or extrahepatic ductal dilatation.  Right kidney is malrotated.  Left kidney is within normal limits.  No evidence of bowel obstruction.  Abnormal appendix with associated wall thickening/periappendiceal inflammatory changes as well as a distal appendicolith (series 2/image 53).  Secondary inflammatory changes involving the cecum (series 2/image 63) and possibly the terminal ileum (series 2/image 60).  No drainable fluid collection/abscess.  No free air.  Reactive right lower quadrant lymph nodes measuring up to 12 mm short axis (series 2/image 43).  No evidence of abdominal aortic aneurysm.  Uterus and bilateral ovaries are unremarkable.  Small volume pelvic ascites, likely physiologic.   Bladder is within normal limits.  Visualized osseous structures are within normal limits.  IMPRESSION: Acute appendicitis.  No drainable fluid collection/abscess or free air.  Secondary inflammatory changes involving the cecum and possibly the terminal ileum.   Original Report Authenticated By: Charline Bills, M.D.      1. Appendicitis       MDM  31 year old female with abdominal pain with some rebound phenomenon exam. She has mild leukocytosis. Concern for appendicitis, although differential also includes IBS, colitis. To have CT abdomen pelvis for further evaluation.        Olivia Mackie, MD 10/04/11 [phone]

## 2011-10-03 NOTE — ED Notes (Signed)
The pt came from ucc with rlq pain for 24-48 hours.   Diarrhea no nv.  lmp 9-17

## 2011-10-03 NOTE — ED Notes (Signed)
Pt c/o abdominal pain since 10/02/11 symptoms have gotten worse since after eating dinner last p.m  And have gotten progesively worse today.. Right sided/navel pain with lower back pain. Pt states sharp stabbing pain that comes in waves.  Hx of IBS.

## 2011-10-04 ENCOUNTER — Encounter (HOSPITAL_COMMUNITY): Payer: Self-pay | Admitting: Anesthesiology

## 2011-10-04 ENCOUNTER — Emergency Department (HOSPITAL_COMMUNITY): Payer: 59 | Admitting: Anesthesiology

## 2011-10-04 ENCOUNTER — Encounter (HOSPITAL_COMMUNITY): Payer: Self-pay | Admitting: *Deleted

## 2011-10-04 ENCOUNTER — Encounter (HOSPITAL_COMMUNITY): Admission: EM | Disposition: A | Discharge status: Home / Self Care | Payer: Self-pay | Source: Home / Self Care

## 2011-10-04 ENCOUNTER — Emergency Department (HOSPITAL_COMMUNITY): Payer: 59

## 2011-10-04 HISTORY — PX: LAPAROSCOPIC APPENDECTOMY: SHX408

## 2011-10-04 LAB — CBC
Hemoglobin: 13.1 g/dL (ref 12.0–15.0)
MCH: 26.8 pg (ref 26.0–34.0)
MCHC: 33.7 g/dL (ref 30.0–36.0)

## 2011-10-04 SURGERY — APPENDECTOMY, LAPAROSCOPIC
Anesthesia: General | Site: Abdomen | Wound class: Contaminated

## 2011-10-04 MED ORDER — ONDANSETRON HCL 4 MG/2ML IJ SOLN
4.0000 mg | Freq: Four times a day (QID) | INTRAMUSCULAR | Status: DC | PRN
  Administered 2011-10-04: 4 mg via INTRAVENOUS

## 2011-10-04 MED ORDER — ONDANSETRON HCL 4 MG PO TABS
4.0000 mg | ORAL_TABLET | Freq: Four times a day (QID) | ORAL | Status: DC | PRN
  Administered 2011-10-05 – 2011-10-06 (×4): 4 mg via ORAL
  Filled 2011-10-04 (×5): qty 1

## 2011-10-04 MED ORDER — SODIUM CHLORIDE 0.9 % IV SOLN
1.0000 g | Freq: Once | INTRAVENOUS | Status: AC
  Administered 2011-10-04: 1 g via INTRAVENOUS
  Filled 2011-10-04: qty 1

## 2011-10-04 MED ORDER — LACTATED RINGERS IV SOLN
INTRAVENOUS | Status: DC | PRN
  Administered 2011-10-04 (×3): via INTRAVENOUS

## 2011-10-04 MED ORDER — SODIUM CHLORIDE 0.9 % IR SOLN
Status: DC | PRN
  Administered 2011-10-04: 1

## 2011-10-04 MED ORDER — MIDAZOLAM HCL 5 MG/5ML IJ SOLN
INTRAMUSCULAR | Status: DC | PRN
  Administered 2011-10-04: 2 mg via INTRAVENOUS

## 2011-10-04 MED ORDER — HYDROMORPHONE HCL PF 1 MG/ML IJ SOLN
INTRAMUSCULAR | Status: AC
  Filled 2011-10-04: qty 2

## 2011-10-04 MED ORDER — OXYCODONE-ACETAMINOPHEN 5-325 MG PO TABS: 1.0000 | ORAL_TABLET | ORAL | Status: DC | PRN

## 2011-10-04 MED ORDER — KCL IN DEXTROSE-NACL 20-5-0.45 MEQ/L-%-% IV SOLN
INTRAVENOUS | Status: DC
  Administered 2011-10-04 – 2011-10-06 (×7): via INTRAVENOUS
  Filled 2011-10-04 (×7): qty 1000

## 2011-10-04 MED ORDER — BUPIVACAINE-EPINEPHRINE 0.25% -1:200000 IJ SOLN
INTRAMUSCULAR | Status: DC | PRN
  Administered 2011-10-04: 10 mL

## 2011-10-04 MED ORDER — SODIUM CHLORIDE 0.9 % IV SOLN
1.0000 g | INTRAVENOUS | Status: DC
  Administered 2011-10-05 – 2011-10-06 (×2): 1 g via INTRAVENOUS
  Filled 2011-10-04 (×2): qty 1

## 2011-10-04 MED ORDER — ONDANSETRON HCL 4 MG/2ML IJ SOLN: 4.0000 mg | Freq: Once | INTRAMUSCULAR | Status: DC | PRN

## 2011-10-04 MED ORDER — DEXTROSE 5 % IV SOLN
INTRAVENOUS | Status: DC | PRN
  Administered 2011-10-04: 06:00:00 via INTRAVENOUS

## 2011-10-04 MED ORDER — ROCURONIUM BROMIDE 100 MG/10ML IV SOLN
INTRAVENOUS | Status: DC | PRN
  Administered 2011-10-04: 50 mg via INTRAVENOUS
  Administered 2011-10-04: 20 mg via INTRAVENOUS

## 2011-10-04 MED ORDER — LIDOCAINE HCL (CARDIAC) 20 MG/ML IV SOLN
INTRAVENOUS | Status: DC | PRN
  Administered 2011-10-04: 100 mg via INTRAVENOUS

## 2011-10-04 MED ORDER — HYDROMORPHONE HCL PF 1 MG/ML IJ SOLN
0.2500 mg | INTRAMUSCULAR | Status: DC | PRN
  Administered 2011-10-04 (×4): 0.5 mg via INTRAVENOUS

## 2011-10-04 MED ORDER — DIAZEPAM 5 MG/ML IJ SOLN
INTRAMUSCULAR | Status: AC
  Administered 2011-10-04: 5 mg via INTRAVENOUS
  Filled 2011-10-04: qty 2

## 2011-10-04 MED ORDER — OXYCODONE HCL 5 MG PO TABS
5.0000 mg | ORAL_TABLET | Freq: Once | ORAL | Status: DC | PRN
Start: 2011-10-04 — End: 2011-10-04

## 2011-10-04 MED ORDER — BUPIVACAINE-EPINEPHRINE PF 0.25-1:200000 % IJ SOLN
INTRAMUSCULAR | Status: AC
  Filled 2011-10-04: qty 30

## 2011-10-04 MED ORDER — OXYCODONE HCL 5 MG/5ML PO SOLN: 5.0000 mg | Freq: Once | ORAL | Status: DC | PRN

## 2011-10-04 MED ORDER — ONDANSETRON HCL 4 MG/2ML IJ SOLN
INTRAMUSCULAR | Status: DC | PRN
  Administered 2011-10-04: 4 mg via INTRAVENOUS

## 2011-10-04 MED ORDER — HYDROMORPHONE HCL PF 1 MG/ML IJ SOLN
0.5000 mg | INTRAMUSCULAR | Status: DC | PRN
  Administered 2011-10-04 (×2): 0.5 mg via INTRAVENOUS
  Filled 2011-10-04: qty 1

## 2011-10-04 MED ORDER — DIAZEPAM 5 MG/ML IJ SOLN
5.0000 mg | Freq: Once | INTRAMUSCULAR | Status: AC
  Administered 2011-10-04: 5 mg via INTRAVENOUS

## 2011-10-04 MED ORDER — ACETAMINOPHEN 10 MG/ML IV SOLN
1000.0000 mg | Freq: Four times a day (QID) | INTRAVENOUS | Status: AC
  Administered 2011-10-04 – 2011-10-05 (×4): 1000 mg via INTRAVENOUS
  Filled 2011-10-04 (×6): qty 100

## 2011-10-04 MED ORDER — FENTANYL CITRATE 0.05 MG/ML IJ SOLN
INTRAMUSCULAR | Status: DC | PRN
  Administered 2011-10-04: 50 ug via INTRAVENOUS
  Administered 2011-10-04 (×2): 100 ug via INTRAVENOUS

## 2011-10-04 MED ORDER — HYDROMORPHONE HCL PF 1 MG/ML IJ SOLN: 0.5000 mg | Freq: Four times a day (QID) | INTRAMUSCULAR | Status: DC | PRN

## 2011-10-04 MED ORDER — IOHEXOL 300 MG/ML  SOLN
100.0000 mL | Freq: Once | INTRAMUSCULAR | Status: AC | PRN
  Administered 2011-10-04: 100 mL via INTRAVENOUS

## 2011-10-04 MED ORDER — PROPOFOL 10 MG/ML IV BOLUS
INTRAVENOUS | Status: DC | PRN
  Administered 2011-10-04: 200 mg via INTRAVENOUS

## 2011-10-04 MED ORDER — ACETAMINOPHEN 10 MG/ML IV SOLN
INTRAVENOUS | Status: AC
  Filled 2011-10-04: qty 100

## 2011-10-04 SURGICAL SUPPLY — 49 items
APPLIER CLIP 5 13 M/L LIGAMAX5 (MISCELLANEOUS) ×2
APPLIER CLIP ROT 10 11.4 M/L (STAPLE)
BLADE SURG ROTATE 9660 (MISCELLANEOUS) IMPLANT
CANISTER SUCTION 2500CC (MISCELLANEOUS) ×2 IMPLANT
CHLORAPREP W/TINT 26ML (MISCELLANEOUS) ×2 IMPLANT
CLIP APPLIE 5 13 M/L LIGAMAX5 (MISCELLANEOUS) ×1 IMPLANT
CLIP APPLIE ROT 10 11.4 M/L (STAPLE) IMPLANT
CLOTH BEACON ORANGE TIMEOUT ST (SAFETY) ×2 IMPLANT
COVER SURGICAL LIGHT HANDLE (MISCELLANEOUS) ×2 IMPLANT
CUTTER FLEX LINEAR 45M (STAPLE) ×2 IMPLANT
DERMABOND ADHESIVE PROPEN (GAUZE/BANDAGES/DRESSINGS) ×1
DERMABOND ADVANCED (GAUZE/BANDAGES/DRESSINGS) ×1
DERMABOND ADVANCED .7 DNX12 (GAUZE/BANDAGES/DRESSINGS) ×1 IMPLANT
DERMABOND ADVANCED .7 DNX6 (GAUZE/BANDAGES/DRESSINGS) ×1 IMPLANT
DRAPE UTILITY 15X26 W/TAPE STR (DRAPE) ×4 IMPLANT
DRAPE WARM FLUID 44X44 (DRAPE) IMPLANT
ELECT REM PT RETURN 9FT ADLT (ELECTROSURGICAL) ×6
ELECTRODE REM PT RTRN 9FT ADLT (ELECTROSURGICAL) ×3 IMPLANT
ENDOLOOP SUT PDS II  0 18 (SUTURE)
ENDOLOOP SUT PDS II 0 18 (SUTURE) IMPLANT
GLOVE BIO SURGEON STRL SZ 6 (GLOVE) ×4 IMPLANT
GLOVE BIO SURGEON STRL SZ8 (GLOVE) ×2 IMPLANT
GLOVE BIOGEL PI IND STRL 6.5 (GLOVE) ×1 IMPLANT
GLOVE BIOGEL PI IND STRL 8 (GLOVE) ×1 IMPLANT
GLOVE BIOGEL PI INDICATOR 6.5 (GLOVE) ×1
GLOVE BIOGEL PI INDICATOR 8 (GLOVE) ×1
GLOVE EUDERMIC 7 POWDERFREE (GLOVE) ×2 IMPLANT
GOWN PREVENTION PLUS XXLARGE (GOWN DISPOSABLE) ×2 IMPLANT
GOWN STRL NON-REIN LRG LVL3 (GOWN DISPOSABLE) IMPLANT
KIT BASIN OR (CUSTOM PROCEDURE TRAY) ×2 IMPLANT
KIT ROOM TURNOVER OR (KITS) ×2 IMPLANT
NS IRRIG 1000ML POUR BTL (IV SOLUTION) ×2 IMPLANT
PAD ARMBOARD 7.5X6 YLW CONV (MISCELLANEOUS) ×2 IMPLANT
POUCH SPECIMEN RETRIEVAL 10MM (ENDOMECHANICALS) ×2 IMPLANT
RELOAD STAPLE TA45 3.5 REG BLU (ENDOMECHANICALS) ×2 IMPLANT
SCALPEL HARMONIC ACE (MISCELLANEOUS) ×2 IMPLANT
SET IRRIG TUBING LAPAROSCOPIC (IRRIGATION / IRRIGATOR) ×2 IMPLANT
SLEEVE ENDOPATH XCEL 5M (ENDOMECHANICALS) ×2 IMPLANT
SPECIMEN JAR SMALL (MISCELLANEOUS) ×2 IMPLANT
SUT MNCRL AB 4-0 PS2 18 (SUTURE) ×2 IMPLANT
TOWEL OR 17X24 6PK STRL BLUE (TOWEL DISPOSABLE) ×2 IMPLANT
TOWEL OR 17X26 10 PK STRL BLUE (TOWEL DISPOSABLE) ×2 IMPLANT
TRAY FOLEY CATH 14FR (SET/KITS/TRAYS/PACK) ×2 IMPLANT
TRAY LAPAROSCOPIC (CUSTOM PROCEDURE TRAY) ×2 IMPLANT
TROCAR XCEL 12X100 BLDLESS (ENDOMECHANICALS) ×2 IMPLANT
TROCAR XCEL BLADELESS 5X75MML (TROCAR) ×4 IMPLANT
TROCAR XCEL BLUNT TIP 100MML (ENDOMECHANICALS) ×2 IMPLANT
TROCAR XCEL NON-BLD 5MMX100MML (ENDOMECHANICALS) ×2 IMPLANT
WATER STERILE IRR 1000ML POUR (IV SOLUTION) IMPLANT

## 2011-10-04 NOTE — Anesthesia Preprocedure Evaluation (Addendum)
Anesthesia Evaluation  Patient identified by MRN, date of birth, ID band Patient awake    Reviewed: Allergy & Precautions, H&P , NPO status , Patient's Chart, lab work & pertinent test results  Airway Mallampati: I  Neck ROM: Full    Dental  (+) Teeth Intact and Dental Advisory Given   Pulmonary neg pulmonary ROS,  breath sounds clear to auscultation        Cardiovascular negative cardio ROS  Rate:Normal     Neuro/Psych Anxiety negative neurological ROS     GI/Hepatic Neg liver ROS, GERD-  Medicated and Controlled,  Endo/Other  negative endocrine ROS  Renal/GU negative Renal ROS     Musculoskeletal negative musculoskeletal ROS (+)   Abdominal   Peds  Hematology negative hematology ROS (+)   Anesthesia Other Findings   Reproductive/Obstetrics                         Anesthesia Physical Anesthesia Plan  ASA: II and Emergent  Anesthesia Plan: General   Post-op Pain Management:    Induction: Intravenous  Airway Management Planned: Oral ETT  Additional Equipment:   Intra-op Plan:   Post-operative Plan: Extubation in OR  Informed Consent: I have reviewed the patients History and Physical, chart, labs and discussed the procedure including the risks, benefits and alternatives for the proposed anesthesia with the patient or authorized representative who has indicated his/her understanding and acceptance.   Dental advisory given  Plan Discussed with: CRNA, Anesthesiologist and Surgeon  Anesthesia Plan Comments:        Anesthesia Quick Evaluation

## 2011-10-04 NOTE — ED Notes (Signed)
Physician at bedside.

## 2011-10-04 NOTE — Transfer of Care (Signed)
Immediate Anesthesia Transfer of Care Note  Patient: Kristin [name]  Procedure(s) Performed: Procedure(s) (LRB) with comments: APPENDECTOMY LAPAROSCOPIC (N/A)  Patient Location: PACU  Anesthesia Type: General  Level of Consciousness: awake  Airway & Oxygen Therapy: Patient Spontanous Breathing and Patient connected to nasal cannula oxygen  Post-op Assessment: Report given to PACU RN and Post -op Vital signs reviewed and stable  Post vital signs: Reviewed and stable  Complications: No apparent anesthesia complications

## 2011-10-04 NOTE — Op Note (Signed)
Kristin [name], Kristin [name]                ACCOUNT NO.:  [id number]  MEDICAL RECORD NO.:  [id number]  LOCATION:  6N03C                        FACILITY:  [facility]  PHYSICIAN:  Almond Lint, MD       DATE OF BIRTH:  May 05, 1980  DATE OF PROCEDURE:  10/04/2011 DATE OF DISCHARGE:                              OPERATIVE REPORT   PREOPERATIVE DIAGNOSIS:  Acute appendicitis.  POSTOPERATIVE DIAGNOSIS:  Acute inflammatory appendicitis.  PROCEDURE:  Laparoscopic appendectomy.  SURGEON:  Almond Lint, MD  ASSISTANT:  Angelia Mould. Derrell Lolling, MD  FINDINGS:  Severe inflammation of the appendix with 2 appendicoliths and firm surrounding tissues.  ANESTHESIA:  General and local.  ESTIMATED BLOOD LOSS:  200 mL.  Local Marcaine.  SPECIMEN:  Appendix to Pathology.  Counts were correct.  PROCEDURE IN DETAIL:  Ms. Mahl was identified in the holding area and taken to the operating room where she was placed supine on the operating room table.  General anesthesia was induced.  Her abdomen was prepped and draped in sterile fashion.  Time-out was performed according to the surgical safety check list.  When all was correct, we continued.  The infraumbilical skin was anesthetized with local anesthetic.  A curvilinear transverse incision was made with a #11 blade.  Subcutaneous tissues were spread with a Kocher.  The fascia was elevated with 2 Kocher clamps.  The fascia was incised in the midline.  A 0 Vicryl pursestring suture was placed around the incision.  A Hasson trocar was introduced in the abdomen and held in place to the tails of the suture. Pneumoperitoneum was achieved with a pressure of 15 mmHg.  The patient was placed into Trendelenburg position and rotated to the left.  Under direct visualization and after administration of local anesthetic, two 5 mm trocars were placed in the suprapubic region and left lower quadrant.  The appendix was not immediately visible.  The cecum was rotated medially and  the terminal ileum became obvious. Behind the terminal ileum appeared to be a firm structure consistent with the appendix.  The Harmonic Scalpel was used to take down the terminal ileum from the lateral abdominal wall.  The appendix was not able to be grasped due to induration.  Blunt dissection was used to get around the appendix.  The inferior pole of the appendix was completely adherent to the terminal ileum and the superior and medial aspect was adherent to the cecum.  An assistant was called and an additional trocar was placed in the right upper quadrant.  This was then used to help provide counter traction.  The appendix with one dissector was able to be pulled up.  At the point where there was almost free mobility of the appendix, a vessel that was in the terminal ileal mesentery started hemorrhaging.  This was clamped with a Art gallery manager.  A second 5 mm trocar was placed.  The 5 mm clip applier was opened. Several clips were placed, they were unsuccessful.  The decision was made to clamp it and consider converting to open.  One more try was given to get the bleeding vessels.  A clip was then placed in the vertical orientation rather than the  horizontal orientation.  This did stop the bleeding.  The area was irrigated.  The appendiceal base was skeletonized.  The base of the appendix was then stapled with the Endo- GIA stapler.  The appendix was then retrieved through the umbilical port with the EndoCatch bag.  The abdomen was re-irrigated and examined. There was no residual bleeding.  The pneumoperitoneum was allowed to evacuate.  The skin incision was closed with 4-0 Monocryl in subcuticular fashion.  The wounds were cleaned, dried and dressed with Dermabond.  The patient was awakened from anesthesia and taken to PACU in stable condition.  Needle, sponge, and instrument counts were correct x2.     Almond Lint, MD     FB/MEDQ  D:  10/04/2011  T:  10/04/2011  Job:   [id number]

## 2011-10-04 NOTE — ED Notes (Signed)
Pt. Back from CT.

## 2011-10-04 NOTE — Anesthesia Postprocedure Evaluation (Signed)
  Anesthesia Post-op Note  Patient: Kristin [name]  Procedure(s) Performed: Procedure(s) (LRB) with comments: APPENDECTOMY LAPAROSCOPIC (N/A)  Patient Location: PACU  Anesthesia Type: General  Level of Consciousness: awake  Airway and Oxygen Therapy: Patient Spontanous Breathing  Post-op Pain: mild  Post-op Assessment: Post-op Vital signs reviewed, Patient's Cardiovascular Status Stable, Respiratory Function Stable, Patent Airway, No signs of Nausea or vomiting and Pain level controlled  Post-op Vital Signs: stable  Complications: No apparent anesthesia complications

## 2011-10-04 NOTE — Preoperative (Signed)
Beta Blockers   Reason not to administer Beta Blockers:Not Applicable 

## 2011-10-04 NOTE — Anesthesia Procedure Notes (Signed)
Procedure Name: Intubation Date/Time: 10/04/2011 6:09 AM Performed by: Mekia Dipinto S Pre-anesthesia Checklist: Patient identified, Timeout performed, Emergency Drugs available, Suction available and Patient being monitored Patient Re-evaluated:Patient Re-evaluated prior to inductionOxygen Delivery Method: Circle system utilized Preoxygenation: Pre-oxygenation with 100% oxygen Intubation Type: IV induction Ventilation: Mask ventilation without difficulty Laryngoscope Size: Mac and 3 Grade View: Grade I Tube type: Oral Tube size: 7.5 mm Number of attempts: 1 Airway Equipment and Method: Stylet Placement Confirmation: ETT inserted through vocal cords under direct vision,  positive ETCO2 and breath sounds checked- equal and bilateral Secured at: 22 cm Tube secured with: Tape Dental Injury: Teeth and Oropharynx as per pre-operative assessment

## 2011-10-04 NOTE — Brief Op Note (Signed)
10/03/2011 - 10/04/2011  8:20 AM  PATIENT:  Kristin [name]  31 y.o. female  PRE-OPERATIVE DIAGNOSIS:  acute appendicitis  POST-OPERATIVE DIAGNOSIS:  acute appendicitis  PROCEDURE:  Procedure(s) (LRB) with comments: APPENDECTOMY LAPAROSCOPIC (N/A)  SURGEON:  Surgeon(s) and Role:    * Almond Lint, MD - Primary    * Ernestene Mention, MD - Assisting  FINDINGS:  Severe inflammation with firm appendix and surrounding tissues.    ANESTHESIA:   local and general  EBL:  Total I/O In: 1400 [I.V.:1400] Out: 100 [Blood:100]  BLOOD ADMINISTERED:none  DRAINS: none   LOCAL MEDICATIONS USED:  MARCAINE     SPECIMEN:  Source of Specimen:  appendix  DISPOSITION OF SPECIMEN:  PATHOLOGY  COUNTS:  YES  DICTATION: .Other Dictation: Dictation Number [id number]  PLAN OF CARE: Admit to inpatient   PATIENT DISPOSITION:  PACU - hemodynamically stable.   Delay start of Pharmacological VTE agent (>24hrs) due to surgical blood loss or risk of bleeding: yes

## 2011-10-04 NOTE — H&P (Signed)
Kristin [name] is an 31 y.o. female.   Chief Complaint: Acute appendicitis HPI:  Pt is a 31 year old female with 24 hours of worsening abdominal pain.  She denies anorexia, nausea or vomiting.  She has not been sick recently.  She denies fevers/ chills.  She had some abdominal pain earlier this year and got a CT scan that was positive for a fecalith, but no inflammation.  She started having epigastric pain yesterday morning that she describes as "a lot of pressure."  She felt like she would feel better if she could pass gas, but this did not help.  The pain is worse with palpation and with large sudden motions, but walking was OK.  She is hungry.    Past Medical History  Diagnosis Date  . IBS (irritable bowel syndrome)     diarrhea prone  . GERD (gastroesophageal reflux disease)   . Anxiety     Past Surgical History  Procedure Date  . Wisdom tooth extraction     Family History  Problem Relation Age of Onset  . Adopted: Yes   Social History:  reports that she has never smoked. She does not have any smokeless tobacco history on file. She reports that she does not drink alcohol or use illicit drugs.  Allergies:  Allergies  Allergen Reactions  . Morphine And Related Nausea Only  . Vicodin (Hydrocodone-Acetaminophen) Other (See Comments)    Patient stated that gave her panic attack     Medications (Discharged within 1 Day(s))Long-Term  Prescriptions Show Facility-Administered Medications   dicyclomine (BENTYL) 10 MG capsule  OVER THE COUNTER MEDICATION  Probiotic Product (PROBIOTIC DAILY PO)     Results for orders placed during the hospital encounter of 10/03/11 (from the past 48 hour(s))  CBC WITH DIFFERENTIAL     Status: Abnormal   Collection Time   10/03/11  8:27 PM      Component Value Range Comment   WBC 12.7 (*) 4.0 - 10.5 K/uL    RBC 5.21 (*) 3.87 - 5.11 MIL/uL    Hemoglobin 14.0  12.0 - 15.0 g/dL    HCT 16.1  09.6 - 04.5 %    MCV 79.3  78.0 - 100.0 fL    MCH 26.9   26.0 - 34.0 pg    MCHC 33.9  30.0 - 36.0 g/dL    RDW 40.9  81.1 - 91.4 %    Platelets 186  150 - 400 K/uL    Neutrophils Relative 78 (*) 43 - 77 %    Neutro Abs 9.9 (*) 1.7 - 7.7 K/uL    Lymphocytes Relative 15  12 - 46 %    Lymphs Abs 2.0  0.7 - 4.0 K/uL    Monocytes Relative 6  3 - 12 %    Monocytes Absolute 0.7  0.1 - 1.0 K/uL    Eosinophils Relative 1  0 - 5 %    Eosinophils Absolute 0.1  0.0 - 0.7 K/uL    Basophils Relative 0  0 - 1 %    Basophils Absolute 0.0  0.0 - 0.1 K/uL   COMPREHENSIVE METABOLIC PANEL     Status: Abnormal   Collection Time   10/03/11  8:27 PM      Component Value Range Comment   Sodium 136  135 - 145 mEq/L    Potassium 3.6  3.5 - 5.1 mEq/L    Chloride 100  96 - 112 mEq/L    CO2 22  19 - 32  mEq/L    Glucose, Bld 107 (*) 70 - 99 mg/dL    BUN 8  6 - 23 mg/dL    Creatinine, Ser 1.09  0.50 - 1.10 mg/dL    Calcium 9.6  8.4 - 60.4 mg/dL    Total Protein 7.6  6.0 - 8.3 g/dL    Albumin 4.0  3.5 - 5.2 g/dL    AST 14  0 - 37 U/L    ALT 10  0 - 35 U/L    Alkaline Phosphatase 80  39 - 117 U/L    Total Bilirubin 0.2 (*) 0.3 - 1.2 mg/dL    GFR calc non Af Amer >90  >90 mL/min    GFR calc Af Amer >90  >90 mL/min   LIPASE, BLOOD     Status: Normal   Collection Time   10/03/11  8:27 PM      Component Value Range Comment   Lipase 32  11 - 59 U/L   URINALYSIS, ROUTINE W REFLEX MICROSCOPIC     Status: Normal   Collection Time   10/03/11  8:28 PM      Component Value Range Comment   Color, Urine YELLOW  YELLOW    APPearance CLEAR  CLEAR    Specific Gravity, Urine 1.016  1.005 - 1.030    pH 8.0  5.0 - 8.0    Glucose, UA NEGATIVE  NEGATIVE mg/dL    Hgb urine dipstick NEGATIVE  NEGATIVE    Bilirubin Urine NEGATIVE  NEGATIVE    Ketones, ur NEGATIVE  NEGATIVE mg/dL    Protein, ur NEGATIVE  NEGATIVE mg/dL    Urobilinogen, UA 0.2  0.0 - 1.0 mg/dL    Nitrite NEGATIVE  NEGATIVE    Leukocytes, UA NEGATIVE  NEGATIVE MICROSCOPIC NOT DONE ON URINES WITH NEGATIVE PROTEIN,  BLOOD, LEUKOCYTES, NITRITE, OR GLUCOSE <1000 mg/dL.  PREGNANCY, URINE     Status: Normal   Collection Time   10/03/11  8:28 PM      Component Value Range Comment   Preg Test, Ur NEGATIVE  NEGATIVE   WET PREP, GENITAL     Status: Normal   Collection Time   10/03/11 11:27 PM      Component Value Range Comment   Yeast Wet Prep HPF POC NONE SEEN  NONE SEEN    Trich, Wet Prep NONE SEEN  NONE SEEN    Clue Cells Wet Prep HPF POC NONE SEEN  NONE SEEN    WBC, Wet Prep HPF POC NONE SEEN  NONE SEEN SPECIMEN SUBMITTED IN SALINE   Ct Abdomen Pelvis W Contrast  10/04/2011  *RADIOLOGY REPORT*  Clinical Data: Abdominal pain, history of IBS  CT ABDOMEN AND PELVIS WITH CONTRAST  Technique:  Multidetector CT imaging of the abdomen and pelvis was performed following the standard protocol during bolus administration of intravenous contrast.  Contrast: OMNIPAQUE IOHEXOL 300 MG/ML  SOLN  Comparison: 01/24/2011  Findings: Lung bases are clear.  Liver, spleen, pancreas, and adrenal glands within normal limits.  Gallbladder is unremarkable.  No intrahepatic or extrahepatic ductal dilatation.  Right kidney is malrotated.  Left kidney is within normal limits.  No evidence of bowel obstruction.  Abnormal appendix with associated wall thickening/periappendiceal inflammatory changes as well as a distal appendicolith (series 2/image 53).  Secondary inflammatory changes involving the cecum (series 2/image 63) and possibly the terminal ileum (series 2/image 60).  No drainable fluid collection/abscess.  No free air.  Reactive right lower quadrant lymph nodes measuring up  to 12 mm short axis (series 2/image 43).  No evidence of abdominal aortic aneurysm.  Uterus and bilateral ovaries are unremarkable.  Small volume pelvic ascites, likely physiologic.  Bladder is within normal limits.  Visualized osseous structures are within normal limits.  IMPRESSION: Acute appendicitis.  No drainable fluid collection/abscess or free air.   Secondary inflammatory changes involving the cecum and possibly the terminal ileum.   Original Report Authenticated By: Charline Bills, M.D.     Review of Systems  Constitutional: Negative.   HENT: Negative.   Eyes: Positive for redness.  Respiratory: Negative.   Cardiovascular: Negative.   Gastrointestinal: Positive for abdominal pain and diarrhea. Negative for nausea, vomiting and constipation.  Genitourinary: Negative.   Musculoskeletal: Negative.   Skin: Negative.   Neurological: Negative.   Endo/Heme/Allergies: Negative.   Psychiatric/Behavioral: Negative.     Blood pressure 117/65, pulse 79, temperature 98.1 F (36.7 C), temperature source Oral, resp. rate 16, last menstrual period 09/29/2011, SpO2 99.00%. Physical Exam  Constitutional: She is oriented to person, place, and time. She appears well-developed and well-nourished. No distress.  HENT:  Head: Normocephalic and atraumatic.  Right Ear: External ear normal.  Left Ear: External ear normal.  Eyes: Pupils are equal, round, and reactive to light. No scleral icterus.  Neck: Normal range of motion. Neck supple. No tracheal deviation present. No thyromegaly present.  Cardiovascular: Normal rate, regular rhythm and intact distal pulses.   Respiratory: Effort normal. No respiratory distress. She exhibits no tenderness.  GI: Soft. She exhibits distension (mild). There is tenderness (RLQ). There is guarding (voluntary guarding). There is no rebound.       + Rovsing's sign  Musculoskeletal: Normal range of motion. She exhibits no edema and no tenderness.  Lymphadenopathy:    She has no cervical adenopathy.  Neurological: She is alert and oriented to person, place, and time. Coordination normal.  Skin: Skin is warm and dry. No rash noted. She is not diaphoretic. No erythema. No pallor.  Psychiatric: She has a normal mood and affect. Her behavior is normal. Judgment and thought content normal.     Assessment/Plan Acute  appendicitis.    IVF IV Antibiotics NPO To OR for lap appy Pt does have some periappendiceal inflammatory changes that may  Appendectomy was described to the patient.  The incisions and surgical technique were explained.  The patient was advised that a foley catheter would be placed.  I advised the patient of the risks of surgery including, but not limited to, bleeding, infection, damage to other structures, risk of an open operation, risk of abscess, and risk of blood clot.  The recovery was also described to the patient.  She was advised that he will have lifting restrictions for 2 weeks.     Rilei Kravitz 10/04/2011, 5:21 AM

## 2011-10-05 LAB — GC/CHLAMYDIA PROBE AMP, GENITAL
Chlamydia, DNA Probe: NEGATIVE
GC Probe Amp, Genital: NEGATIVE

## 2011-10-05 LAB — BASIC METABOLIC PANEL
Calcium: 8.7 mg/dL (ref 8.4–10.5)
Chloride: 105 mEq/L (ref 96–112)
Creatinine, Ser: 0.75 mg/dL (ref 0.50–1.10)
GFR calc Af Amer: >90 mL/min (ref >90)

## 2011-10-05 LAB — URINE CULTURE

## 2011-10-05 LAB — CBC
MCH: 26.3 pg (ref 26.0–34.0)
MCV: 80.3 fL (ref 78.0–100.0)
Platelets: 166 10*3/uL (ref 150–400)
RDW: 13 % (ref 11.5–15.5)
WBC: 10.2 10*3/uL (ref 4.0–10.5)

## 2011-10-05 MED ORDER — HYDROCODONE-ACETAMINOPHEN 5-325 MG PO TABS
1.0000 | ORAL_TABLET | ORAL | Status: DC | PRN
  Administered 2011-10-05 (×4): 1 via ORAL
  Filled 2011-10-05 (×4): qty 1

## 2011-10-05 NOTE — Progress Notes (Signed)
Utilization review complete 

## 2011-10-05 NOTE — Progress Notes (Signed)
Patient ID: Kristin [name], female   DOB: [birth year], 31 y.o.   MRN: [id number] 1 Day Post-Op  Subjective: Pt with expected tenderness in umbilical incision, pain in RLQ when up moving but not constant, poor appitite but no nausea with eating, vomited yesterday with dilaudid.    Objective: Vital signs in last 24 hours: Temp:  [97.4 F (36.3 C)-99.2 F (37.3 C)] 99.2 F (37.3 C) (09/23 0503) Pulse Rate:  [68-89] 89  (09/23 0503) Resp:  [14-19] 16  (09/23 0503) BP: (120-130)/(68-78) 120/70 mmHg (09/23 0503) SpO2:  [97 %-100 %] 97 % (09/23 0503) Weight:  [178 lb (80.74 kg)] 178 lb (80.74 kg) (09/22 1358) Last BM Date: 10/03/11  Intake/Output from previous day: 09/22 0701 - 09/23 0700 In: 3320 [P.O.:120; I.V.:3000; IV Piggyback:200] Out: 100 [Blood:100] Intake/Output this shift:    PE: Abd: soft, tender over incisions and mild tender over RLQ, no guarding, +BS  Lab Results:   Basename 10/05/11 0540 10/04/11 1606  WBC 10.2 12.1*  HGB 12.4 13.1  HCT 37.9 38.9  PLT 166 153   BMET  Basename 10/05/11 0540 10/03/11 2027  NA 138 136  K 3.8 3.6  CL 105 100  CO2 22 22  GLUCOSE 127* 107*  BUN 3* 8  CREATININE 0.75 0.74  CALCIUM 8.7 9.6   PT/INR No results found for this basename: LABPROT:2,INR:2 in the last 72 hours CMP     Component Value Date/Time   NA 138 10/05/2011 0540   K 3.8 10/05/2011 0540   CL 105 10/05/2011 0540   CO2 22 10/05/2011 0540   GLUCOSE 127* 10/05/2011 0540   BUN 3* 10/05/2011 0540   CREATININE 0.75 10/05/2011 0540   CALCIUM 8.7 10/05/2011 0540   PROT 7.6 10/03/2011 2027   ALBUMIN 4.0 10/03/2011 2027   AST 14 10/03/2011 2027   ALT 10 10/03/2011 2027   ALKPHOS 80 10/03/2011 2027   BILITOT 0.2* 10/03/2011 2027   GFRNONAA >90 10/05/2011 0540   GFRAA >90 10/05/2011 0540   Lipase     Component Value Date/Time   LIPASE 32 10/03/2011 2027       Studies/Results: Ct Abdomen Pelvis W Contrast  10/04/2011  *RADIOLOGY REPORT*  Clinical Data: Abdominal pain,  history of IBS  CT ABDOMEN AND PELVIS WITH CONTRAST  Technique:  Multidetector CT imaging of the abdomen and pelvis was performed following the standard protocol during bolus administration of intravenous contrast.  Contrast: OMNIPAQUE IOHEXOL 300 MG/ML  SOLN  Comparison: 01/24/2011  Findings: Lung bases are clear.  Liver, spleen, pancreas, and adrenal glands within normal limits.  Gallbladder is unremarkable.  No intrahepatic or extrahepatic ductal dilatation.  Right kidney is malrotated.  Left kidney is within normal limits.  No evidence of bowel obstruction.  Abnormal appendix with associated wall thickening/periappendiceal inflammatory changes as well as a distal appendicolith (series 2/image 53).  Secondary inflammatory changes involving the cecum (series 2/image 63) and possibly the terminal ileum (series 2/image 60).  No drainable fluid collection/abscess.  No free air.  Reactive right lower quadrant lymph nodes measuring up to 12 mm short axis (series 2/image 43).  No evidence of abdominal aortic aneurysm.  Uterus and bilateral ovaries are unremarkable.  Small volume pelvic ascites, likely physiologic.  Bladder is within normal limits.  Visualized osseous structures are within normal limits.  IMPRESSION: Acute appendicitis.  No drainable fluid collection/abscess or free air.  Secondary inflammatory changes involving the cecum and possibly the terminal ileum.   Original Report Authenticated By:  Charline Bills, M.D.     Anti-infectives: Anti-infectives     Start     Dose/Rate Route Frequency Ordered Stop   10/05/11 0600   ertapenem (INVANZ) 1 g in sodium chloride 0.9 % 50 mL IVPB        1 g 100 mL/hr over 30 Minutes Intravenous Every 24 hours 10/04/11 0946     10/04/11 0515   ertapenem (INVANZ) 1 g in sodium chloride 0.9 % 50 mL IVPB        1 g 100 mL/hr over 30 Minutes Intravenous  Once 10/04/11 0514 10/04/11 0617           Assessment/Plan  1. POD#1-lap appy: expected tenderness  from surgery, WBC normalized now, advance diet as tolerating, will keep here on IV abx today and then likely home tomorrow, recheck labs in AM.   LOS: 2 days    WHITE, [facility] 10/05/2011

## 2011-10-06 ENCOUNTER — Encounter (HOSPITAL_COMMUNITY): Payer: Self-pay | Admitting: General Surgery

## 2011-10-06 LAB — CBC
HCT: 36.5 % (ref 36.0–46.0)
MCH: 26.4 pg (ref 26.0–34.0)
MCV: 81.1 fL (ref 78.0–100.0)
Platelets: 164 10*3/uL (ref 150–400)
RBC: 4.5 MIL/uL (ref 3.87–5.11)
RDW: 12.9 % (ref 11.5–15.5)
WBC: 9.7 10*3/uL (ref 4.0–10.5)

## 2011-10-06 LAB — BASIC METABOLIC PANEL
GFR calc Af Amer: >90 mL/min (ref >90)
GFR calc non Af Amer: >90 mL/min (ref >90)
Potassium: 3.4 mEq/L — ABNORMAL LOW (ref 3.5–5.1)
Sodium: 138 mEq/L (ref 135–145)

## 2011-10-06 MED ORDER — ONDANSETRON HCL 4 MG PO TABS: 4.0000 mg | ORAL_TABLET | Freq: Four times a day (QID) | ORAL | Status: DC | PRN

## 2011-10-06 MED ORDER — HYDROCODONE-ACETAMINOPHEN 5-325 MG PO TABS: 1.0000 | ORAL_TABLET | ORAL | Status: DC | PRN

## 2011-10-06 NOTE — Progress Notes (Signed)
Patient discharged via wheelchair to home. Mother at bedside during discharge instruction. Medications and discharge instructions reviewed with patient. Questions about medications answered. Patient also received information regarding a lap appy.

## 2011-10-06 NOTE — Discharge Summary (Signed)
Physician Discharge Summary  Patient ID: Kristin [name] MRN: [id number] DOB/AGE: [birth year] 31 y.o.  Admit date: 10/03/2011 Discharge date: 10/06/2011  Admission Diagnoses: Abdominal pain  Discharge Diagnoses: Acute appendicitis S/P laparoscopic appendectomy Active Problems:  * No active hospital problems. *    Discharged Condition: stable  Hospital Course: Pt is a 31 year old female who presented to [facility] with 24 hours of worsening abdominal pain. She denied anorexia, nausea or vomiting. She has not been sick recently. She denied fevers/ chills. She had some abdominal pain earlier this year and got a CT scan that was positive for a fecalith, but no inflammation. She started having epigastric pain the day prior to coming to the hospital in the morning that she describes as "a lot of pressure." She felt like she would feel better if she could pass gas, but this did not help. The pain was worse with palpation and with large sudden motions, but walking was OK.  CT of abdomen and pelvis done on admission:  No evidence of bowel obstruction. Abnormal appendix with  associated wall thickening/periappendiceal inflammatory changes as  well as a distal appendicolith (series 2/image 53). Secondary  inflammatory changes involving the cecum (series 2/image 63) and  possibly the terminal ileum (series 2/image 60). Impression: Acute appendicitis. Based on clinical exam, and CT results she was taken to the operating room for a laparoscopic appendectomy which was preformed by Dr. Donell [name]. Post operatively she has remained HD stable and afebrile. WBC count is now wnl. She is tolerating a diet and has had a return of bowel function. She is now stable for discharge to home/self care. We will send her home on 10 days of po Augmentin with scheduled follow up with Dr. Donell [name] in our office.   Consults: None  Significant Diagnostic Studies: labs and microbiology  Treatments: IV hydration, antibiotics, analgesia,  respiratory therapy and surgery.  Discharge Exam: Blood pressure 113/65, pulse 89, temperature 98.6 F (37 C), temperature source Oral, resp. rate 18, height 5\' 5"  (1.651 m), weight 178 lb (80.74 kg), last menstrual period 09/29/2011, SpO2 98.00%. General appearance: alert, cooperative, appears stated age and no distress Chest: CTA Cardiac: RRR Abdomen: soft, appropriately tender around surgical sites, there is a approx 4cm circumferential area of ecchymosis just below the umbilicus; It easily blanches with light palpation, and is not any more painful than the rest of her abdomen on exam. H&H stable.   Disposition: Home/self care Follow up with Dr. Donell [name]. As scheduled.  Discharge Orders    Future Appointments: Provider: Department: Dept Phone: Center:   10/14/2011 5:00 PM Kristin Lint, MD [facility] [phone] None       Medication List     As of 10/06/2011 11:37 AM    ASK your doctor about these medications         dicyclomine 10 MG capsule   Commonly known as: BENTYL   Take 10 mg by mouth 4 (four) times daily -  before meals and at bedtime.      OVER THE COUNTER MEDICATION   Take 2 tablets by mouth every evening. HOMEOPATHIC GAS RELIEF      PROBIOTIC DAILY PO   Take 1 tablet by mouth every morning. PRIMAL DEFENSE         Signed: [name] 10/06/2011, 11:37 AM

## 2011-10-14 ENCOUNTER — Encounter (INDEPENDENT_AMBULATORY_CARE_PROVIDER_SITE_OTHER): Payer: Self-pay | Admitting: General Surgery

## 2011-10-14 ENCOUNTER — Ambulatory Visit (INDEPENDENT_AMBULATORY_CARE_PROVIDER_SITE_OTHER): Payer: Commercial Managed Care - PPO | Admitting: General Surgery

## 2011-10-14 VITALS — BP 120/80 | HR 72 | Temp 98.9°F | Ht 65.0 in | Wt 183.6 lb

## 2011-10-14 DIAGNOSIS — K3580 Unspecified acute appendicitis: Secondary | ICD-10-CM

## 2011-10-14 NOTE — Patient Instructions (Addendum)
Follow up as needed.    Call for fever/chills, change in bowel habits.    May resume work without restrictions Monday oct 7.

## 2011-10-26 DIAGNOSIS — K3580 Unspecified acute appendicitis: Secondary | ICD-10-CM | POA: Insufficient documentation

## 2011-10-26 NOTE — Assessment & Plan Note (Signed)
Pt doing well. No evidence of abscess.  Will see as needed.

## 2011-10-26 NOTE — Progress Notes (Signed)
HISTORY: Patient is status post laparoscopic appendectomy for acute appendicitis.  She had such severe inflammation, assistant was required to get out her appendix. We were able to complete the procedure laparoscopically. She denies fevers and chills. She is slowly getting her energy back.   EXAM: General:  Alert and oriented Incision:  Healing well.  No signs of infection.  Abd still sl tender in RLQ   PATHOLOGY: Appendix, Other than Incidental - ACUTE FULL THICKNESS APPENDICITIS. - NO TUMOR SEEN.   ASSESSMENT AND PLAN:   Appendicitis, acute Pt doing well. No evidence of abscess.  Will see as needed.         Maudry Diego, MD Surgical Oncology, General & Endocrine Surgery [facility]  Rene Paci, MD Newt Lukes, MD

## 2012-07-04 ENCOUNTER — Telehealth: Payer: Self-pay | Admitting: *Deleted

## 2012-07-04 DIAGNOSIS — Z0000 Encounter for general adult medical examination without abnormal findings: Secondary | ICD-10-CM

## 2012-07-04 NOTE — Telephone Encounter (Signed)
Entered cpx labs...lmb 

## 2012-07-04 NOTE — Telephone Encounter (Signed)
Message copied by Deatra James on Mon Jul 04, 2012  4:47 PM ------      Message from: Livingston Diones      Created: Mon Jul 04, 2012  4:13 PM       Pt cpe appt 08/31/12. Please put lab work a week prior, [employer] is the insurance.  ------

## 2012-08-31 ENCOUNTER — Ambulatory Visit (INDEPENDENT_AMBULATORY_CARE_PROVIDER_SITE_OTHER): Payer: 59 | Admitting: Internal Medicine

## 2012-08-31 ENCOUNTER — Other Ambulatory Visit (INDEPENDENT_AMBULATORY_CARE_PROVIDER_SITE_OTHER): Payer: 59

## 2012-08-31 ENCOUNTER — Encounter: Payer: Self-pay | Admitting: Internal Medicine

## 2012-08-31 VITALS — BP 122/90 | HR 78 | Temp 98.5°F | Wt 192.0 lb

## 2012-08-31 DIAGNOSIS — Z0000 Encounter for general adult medical examination without abnormal findings: Secondary | ICD-10-CM

## 2012-08-31 DIAGNOSIS — E669 Obesity, unspecified: Secondary | ICD-10-CM

## 2012-08-31 LAB — CBC WITH DIFFERENTIAL/PLATELET
Eosinophils Absolute: 0.1 10*3/uL (ref 0.0–0.7)
HCT: 43.4 % (ref 36.0–46.0)
Lymphs Abs: 2.1 10*3/uL (ref 0.7–4.0)
MCHC: 34.2 g/dL (ref 30.0–36.0)
MCV: 78.1 fl (ref 78.0–100.0)
Monocytes Absolute: 0.4 10*3/uL (ref 0.1–1.0)
Neutrophils Relative %: 65.3 % (ref 43.0–77.0)
Platelets: 215 10*3/uL (ref 150.0–400.0)

## 2012-08-31 LAB — URINALYSIS, ROUTINE W REFLEX MICROSCOPIC
Bilirubin Urine: NEGATIVE
Ketones, ur: NEGATIVE
Leukocytes, UA: NEGATIVE
Nitrite: NEGATIVE
Specific Gravity, Urine: >=1.03 (ref 1.000–1.030)
Urobilinogen, UA: 0.2 (ref 0.0–1.0)
pH: 6 (ref 5.0–8.0)

## 2012-08-31 LAB — LIPID PANEL
Cholesterol: 173 mg/dL (ref 0–200)
HDL: 32.4 mg/dL — ABNORMAL LOW (ref >39.00)
Triglycerides: 245 mg/dL — ABNORMAL HIGH (ref 0.0–149.0)

## 2012-08-31 LAB — BASIC METABOLIC PANEL
BUN: 11 mg/dL (ref 6–23)
Calcium: 9.1 mg/dL (ref 8.4–10.5)
GFR: 69.38 mL/min (ref >60.00)
Potassium: 4.1 mEq/L (ref 3.5–5.1)
Sodium: 138 mEq/L (ref 135–145)

## 2012-08-31 LAB — HEPATIC FUNCTION PANEL
ALT: 12 U/L (ref 0–35)
AST: 15 U/L (ref 0–37)
Albumin: 4.1 g/dL (ref 3.5–5.2)
Alkaline Phosphatase: 75 U/L (ref 39–117)
Bilirubin, Direct: 0 mg/dL (ref 0.0–0.3)
Total Protein: 7.7 g/dL (ref 6.0–8.3)

## 2012-08-31 NOTE — Patient Instructions (Signed)
It was good to see you today. Health Maintenance reviewed - all recommended immunizations and age-appropriate screenings are up-to-date. Test(s) ordered today. Your results will be released to MyChart (or called to you) after review, usually within 72hours after test completion. If any changes need to be made, you will be notified at that same time. Medications reviewed and updated, no changes recommended at this time. Work on lifestyle changes as discussed (low fat, low carb, increased protein diet; improved exercise efforts; weight loss) to control sugar, blood pressure and cholesterol levels and/or reduce risk of developing other medical problems. Look into [email or web address] or other type of food journal to assist you in this process. Please schedule followup in 12 months for physical and labs, call sooner if problems.   Health Maintenance, Females A healthy lifestyle and preventative care can promote health and wellness.  Maintain regular health, dental, and eye exams.  Eat a healthy diet. Foods like vegetables, fruits, whole grains, low-fat dairy products, and lean protein foods contain the nutrients you need without too many calories. Decrease your intake of foods high in solid fats, added sugars, and salt. Get information about a proper diet from your caregiver, if necessary.  Regular physical exercise is one of the most important things you can do for your health. Most adults should get at least 150 minutes of moderate-intensity exercise (any activity that increases your heart rate and causes you to sweat) each week. In addition, most adults need muscle-strengthening exercises on 2 or more days a week.   Maintain a healthy weight. The body mass index (BMI) is a screening tool to identify possible weight problems. It provides an estimate of body fat based on height and weight. Your caregiver can help determine your BMI, and can help you achieve or maintain a healthy weight. For adults 20  years and older:  A BMI below 18.5 is considered underweight.  A BMI of 18.5 to 24.9 is normal.  A BMI of 25 to 29.9 is considered overweight.  A BMI of 30 and above is considered obese.  Maintain normal blood lipids and cholesterol by exercising and minimizing your intake of saturated fat. Eat a balanced diet with plenty of fruits and vegetables. Blood tests for lipids and cholesterol should begin at age 52 and be repeated every 5 years. If your lipid or cholesterol levels are high, you are over 50, or you are a high risk for heart disease, you may need your cholesterol levels checked more frequently.Ongoing high lipid and cholesterol levels should be treated with medicines if diet and exercise are not effective.  If you smoke, find out from your caregiver how to quit. If you do not use tobacco, do not start.  If you are pregnant, do not drink alcohol. If you are breastfeeding, be very cautious about drinking alcohol. If you are not pregnant and choose to drink alcohol, do not exceed 1 drink per day. One drink is considered to be 12 ounces (355 mL) of beer, 5 ounces (148 mL) of wine, or 1.5 ounces (44 mL) of liquor.  Avoid use of street drugs. Do not share needles with anyone. Ask for help if you need support or instructions about stopping the use of drugs.  High blood pressure causes heart disease and increases the risk of stroke. Blood pressure should be checked at least every 1 to 2 years. Ongoing high blood pressure should be treated with medicines, if weight loss and exercise are not effective.  If you are 55 to  31 years old, ask your caregiver if you should take aspirin to prevent strokes.  Diabetes screening involves taking a blood sample to check your fasting blood sugar level. This should be done once every 3 years, after age 2, if you are within normal weight and without risk factors for diabetes. Testing should be considered at a younger age or be carried out more frequently if  you are overweight and have at least 1 risk factor for diabetes.  Breast cancer screening is essential preventative care for women. You should practice "breast self-awareness." This means understanding the normal appearance and feel of your breasts and may include breast self-examination. Any changes detected, no matter how small, should be reported to a caregiver. Women in their 13s and 30s should have a clinical breast exam (CBE) by a caregiver as part of a regular health exam every 1 to 3 years. After age 8, women should have a CBE every year. Starting at age 71, women should consider having a mammogram (breast X-ray) every year. Women who have a family history of breast cancer should talk to their caregiver about genetic screening. Women at a high risk of breast cancer should talk to their caregiver about having an MRI and a mammogram every year.  The Pap test is a screening test for cervical cancer. Women should have a Pap test starting at age 31. Between ages 28 and 30, Pap tests should be repeated every 2 years. Beginning at age 15, you should have a Pap test every 3 years as long as the past 3 Pap tests have been normal. If you had a hysterectomy for a problem that was not cancer or a condition that could lead to cancer, then you no longer need Pap tests. If you are between ages 51 and 66, and you have had normal Pap tests going back 10 years, you no longer need Pap tests. If you have had past treatment for cervical cancer or a condition that could lead to cancer, you need Pap tests and screening for cancer for at least 20 years after your treatment. If Pap tests have been discontinued, risk factors (such as a new sexual partner) need to be reassessed to determine if screening should be resumed. Some women have medical problems that increase the chance of getting cervical cancer. In these cases, your caregiver may recommend more frequent screening and Pap tests.  The human papillomavirus (HPV) test is  an additional test that may be used for cervical cancer screening. The HPV test looks for the virus that can cause the cell changes on the cervix. The cells collected during the Pap test can be tested for HPV. The HPV test could be used to screen women aged 34 years and older, and should be used in women of any age who have unclear Pap test results. After the age of 24, women should have HPV testing at the same frequency as a Pap test.  Colorectal cancer can be detected and often prevented. Most routine colorectal cancer screening begins at the age of 44 and continues through age 29. However, your caregiver may recommend screening at an earlier age if you have risk factors for colon cancer. On a yearly basis, your caregiver may provide home test kits to check for hidden blood in the stool. Use of a small camera at the end of a tube, to directly examine the colon (sigmoidoscopy or colonoscopy), can detect the earliest forms of colorectal cancer. Talk to your caregiver about this at age  50, when routine screening begins. Direct examination of the colon should be repeated every 5 to 10 years through age 74, unless early forms of pre-cancerous polyps or small growths are found.  Hepatitis C blood testing is recommended for all people born from [birth year] through 1964 and any individual with known risks for hepatitis C.  Practice safe sex. Use condoms and avoid high-risk sexual practices to reduce the spread of sexually transmitted infections (STIs). Sexually active women aged 17 and younger should be checked for Chlamydia, which is a common sexually transmitted infection. Older women with new or multiple partners should also be tested for Chlamydia. Testing for other STIs is recommended if you are sexually active and at increased risk.  Osteoporosis is a disease in which the bones lose minerals and strength with aging. This can result in serious bone fractures. The risk of osteoporosis can be identified using a bone  density scan. Women ages 44 and over and women at risk for fractures or osteoporosis should discuss screening with their caregivers. Ask your caregiver whether you should be taking a calcium supplement or vitamin D to reduce the rate of osteoporosis.  Menopause can be associated with physical symptoms and risks. Hormone replacement therapy is available to decrease symptoms and risks. You should talk to your caregiver about whether hormone replacement therapy is right for you.  Use sunscreen with a sun protection factor (SPF) of 30 or greater. Apply sunscreen liberally and repeatedly throughout the day. You should seek shade when your shadow is shorter than you. Protect yourself by wearing long sleeves, pants, a wide-brimmed hat, and sunglasses year round, whenever you are outdoors.  Notify your caregiver of new moles or changes in moles, especially if there is a change in shape or color. Also notify your caregiver if a mole is larger than the size of a pencil eraser.  Stay current with your immunizations. Document Released: 07/14/2010 Document Revised: 03/23/2011 Document Reviewed: 07/14/2010 [facility] Patient Information 2014 [place], [place].

## 2012-08-31 NOTE — Progress Notes (Signed)
  Subjective:    Patient ID: Kristin [name], female    DOB: 07/05/1980, 32 y.o.   MRN: [id number]  HPI  patient is here today for annual physical. Patient feels well overall. Also reviewed interval events  Past Medical History  Diagnosis Date  . IBS (irritable bowel syndrome)     diarrhea prone  . GERD (gastroesophageal reflux disease)   . Anxiety    Family History  Problem Relation Age of Onset  . Adopted: Yes  . Kidney disease Father    History  Substance Use Topics  . Smoking status: Never Smoker   . Smokeless tobacco: Not on file  . Alcohol Use: No    Review of Systems  Constitutional: Negative for fever or weight change.  Respiratory: Negative for cough and shortness of breath.   Cardiovascular: Negative for chest pain or palpitations.  Gastrointestinal: Negative for abdominal pain, no bowel changes.  Musculoskeletal: Negative for gait problem or joint swelling.  Skin: Negative for rash.  Neurological: Negative for dizziness or headache.  No other specific complaints in a complete review of systems (except as listed in HPI above).     Objective:   Physical Exam  BP 122/90  Pulse 78  Temp(Src) 98.5 F (36.9 C) (Oral)  Wt 192 lb (87.091 kg)  BMI 31.95 kg/m2  SpO2 99% Wt Readings from Last 3 Encounters:  08/31/12 192 lb (87.091 kg)  10/14/11 183 lb 9.6 oz (83.28 kg)  10/04/11 178 lb (80.74 kg)   Constitutional: She is overweight, but appears well-developed and well-nourished. No distress.  HENT: Head: Normocephalic and atraumatic. Ears: B TMs ok, no erythema or effusion; Nose: Nose normal. Mouth/Throat: Oropharynx is clear and moist. No oropharyngeal exudate.  Eyes: Conjunctivae and EOM are normal. Pupils are equal, round, and reactive to light. No scleral icterus.  Neck: Normal range of motion. Neck supple. No JVD or LAD present. No thyromegaly present.  Cardiovascular: Normal rate, regular rhythm and normal heart sounds.  No murmur heard. No BLE  edema. Pulmonary/Chest: Effort normal and breath sounds normal. No respiratory distress. She has no wheezes.  Abdominal: Soft. Bowel sounds are normal. She exhibits no distension. There is no tenderness. no masses Musculoskeletal: Normal range of motion, no joint effusions. No gross deformities Neurological: She is alert and oriented to person, place, and time. No cranial nerve deficit. Coordination normal.  Skin: Skin is warm and dry. No rash noted. No erythema.  Psychiatric: She has a min anxious mood and affect. Her behavior is normal. Judgment and thought content normal.   Lab Results  Component Value Date   WBC 9.7 10/06/2011   HGB 11.9* 10/06/2011   HCT 36.5 10/06/2011   PLT 164 10/06/2011   GLUCOSE 101* 10/06/2011   CHOL 218* 07/21/2011   TRIG 201.0* 07/21/2011   HDL 44.80 07/21/2011   LDLDIRECT 137.0 07/21/2011   ALT 10 10/03/2011   AST 14 10/03/2011   NA 138 10/06/2011   K 3.4* 10/06/2011   CL 105 10/06/2011   CREATININE 0.79 10/06/2011   BUN 3* 10/06/2011   CO2 25 10/06/2011   TSH 1.89 07/21/2011       Assessment & Plan:  CPX/v70.0 - Patient has been counseled on age-appropriate routine health concerns for screening and prevention. These are reviewed and up-to-date. Immunizations are up-to-date or declined. Labs ordered and reviewed.

## 2012-08-31 NOTE — Assessment & Plan Note (Signed)
Wt Readings from Last 3 Encounters:  08/31/12 192 lb (87.091 kg)  10/14/11 183 lb 9.6 oz (83.28 kg)  10/04/11 178 lb (80.74 kg)   The patient is asked to make an attempt to improve diet and exercise patterns to aid in medical management of this problem.

## 2013-01-22 ENCOUNTER — Encounter (HOSPITAL_COMMUNITY): Payer: Self-pay | Admitting: Emergency Medicine

## 2013-01-22 ENCOUNTER — Emergency Department (HOSPITAL_COMMUNITY)
Admission: EM | Admit: 2013-01-22 | Discharge: 2013-01-22 | Disposition: A | Discharge status: Home / Self Care | Payer: 59 | Attending: Emergency Medicine | Admitting: Emergency Medicine

## 2013-01-22 ENCOUNTER — Emergency Department (HOSPITAL_COMMUNITY): Payer: 59

## 2013-01-22 DIAGNOSIS — M549 Dorsalgia, unspecified: Secondary | ICD-10-CM | POA: Insufficient documentation

## 2013-01-22 DIAGNOSIS — Z79899 Other long term (current) drug therapy: Secondary | ICD-10-CM | POA: Insufficient documentation

## 2013-01-22 DIAGNOSIS — R0789 Other chest pain: Secondary | ICD-10-CM | POA: Insufficient documentation

## 2013-01-22 DIAGNOSIS — R0602 Shortness of breath: Secondary | ICD-10-CM | POA: Insufficient documentation

## 2013-01-22 DIAGNOSIS — R42 Dizziness and giddiness: Secondary | ICD-10-CM | POA: Insufficient documentation

## 2013-01-22 DIAGNOSIS — Z9089 Acquired absence of other organs: Secondary | ICD-10-CM | POA: Insufficient documentation

## 2013-01-22 DIAGNOSIS — Z8742 Personal history of other diseases of the female genital tract: Secondary | ICD-10-CM | POA: Insufficient documentation

## 2013-01-22 DIAGNOSIS — M25519 Pain in unspecified shoulder: Secondary | ICD-10-CM | POA: Insufficient documentation

## 2013-01-22 DIAGNOSIS — K219 Gastro-esophageal reflux disease without esophagitis: Secondary | ICD-10-CM | POA: Insufficient documentation

## 2013-01-22 DIAGNOSIS — Z8659 Personal history of other mental and behavioral disorders: Secondary | ICD-10-CM | POA: Insufficient documentation

## 2013-01-22 LAB — COMPREHENSIVE METABOLIC PANEL
ALK PHOS: 76 U/L (ref 39–117)
ALT: 10 U/L (ref 0–35)
AST: 14 U/L (ref 0–37)
Albumin: 3.8 g/dL (ref 3.5–5.2)
BUN: 13 mg/dL (ref 6–23)
CO2: 25 meq/L (ref 19–32)
Calcium: 9.1 mg/dL (ref 8.4–10.5)
Chloride: 103 mEq/L (ref 96–112)
Creatinine, Ser: 0.96 mg/dL (ref 0.50–1.10)
GFR, EST NON AFRICAN AMERICAN: 78 mL/min — AB (ref >90)
GLUCOSE: 88 mg/dL (ref 70–99)
POTASSIUM: 4.2 meq/L (ref 3.7–5.3)
SODIUM: 139 meq/L (ref 137–147)
Total Bilirubin: 0.4 mg/dL (ref 0.3–1.2)
Total Protein: 7.1 g/dL (ref 6.0–8.3)

## 2013-01-22 LAB — CBC
HCT: 38.9 % (ref 36.0–46.0)
HEMOGLOBIN: 12.9 g/dL (ref 12.0–15.0)
MCH: 26.2 pg (ref 26.0–34.0)
MCHC: 33.2 g/dL (ref 30.0–36.0)
MCV: 78.9 fL (ref 78.0–100.0)
Platelets: 192 10*3/uL (ref 150–400)
RBC: 4.93 MIL/uL (ref 3.87–5.11)
RDW: 12.8 % (ref 11.5–15.5)
WBC: 6.5 10*3/uL (ref 4.0–10.5)

## 2013-01-22 LAB — TROPONIN I

## 2013-01-22 LAB — LIPASE, BLOOD: Lipase: 33 U/L (ref 11–59)

## 2013-01-22 LAB — D-DIMER, QUANTITATIVE (NOT AT ARMC)

## 2013-01-22 MED ORDER — OMEPRAZOLE 20 MG PO CPDR: 20.0000 mg | DELAYED_RELEASE_CAPSULE | Freq: Every day | ORAL | Status: DC

## 2013-01-22 MED ORDER — ALBUTEROL SULFATE (2.5 MG/3ML) 0.083% IN NEBU
5.0000 mg | INHALATION_SOLUTION | Freq: Once | RESPIRATORY_TRACT | Status: AC
  Administered 2013-01-22: 5 mg via RESPIRATORY_TRACT
  Filled 2013-01-22: qty 6

## 2013-01-22 MED ORDER — GI COCKTAIL ~~LOC~~
30.0000 mL | Freq: Once | ORAL | Status: AC
  Administered 2013-01-22: 30 mL via ORAL
  Filled 2013-01-22: qty 30

## 2013-01-22 NOTE — ED Notes (Signed)
Patient reports taht she is having chest tightness and SOB since last night. The patient reports that she has had times of intermittent dizziness.

## 2013-01-22 NOTE — ED Provider Notes (Signed)
CSN: [id number]     Arrival date & time 01/22/13  1454 History   First MD Initiated Contact with Patient 01/22/13 1519     Chief Complaint  Patient presents with  . Chest Pain  . Shortness of Breath   (Consider location/radiation/quality/duration/timing/severity/associated sxs/prior Treatment) The history is provided by the patient and medical records. No language interpreter was used.    ARCADIA GORGAS is a 33 y.o. female  with no major medical Hx presents to the Emergency Department complaining of gradual, waxing and waning, progressively worsening CP and SOB onset approximately one week ago for worsening today after eating pizza. Associated symptoms include left scapular pain, mild lightheadedness, chest heaviness and shortness of breath. Patient reports that the chest pain is substernal and awoke her from sleep last night. Patient has attempted over-the-counter Zantac without relief and nothing seems to make the symptoms better or worse. Patient reports that she's continued her exercise regimen throughout this time without difficulty and does not note any dyspnea on exertion.  Pt denies fever, chills, headache, neck pain, abdominal pain, nausea, vomiting, diarrhea, weakness, CP, no dysuria.    LMP: 01/08/13  - which was heavier than normal with clots and associated lightheadedness; hx of irregular menses.  Patient reports she did not notice the lightheadedness until her last menstrual cycle, but has been intermittent since that time.  Past Medical History  Diagnosis Date  . IBS (irritable bowel syndrome)     diarrhea prone  . GERD (gastroesophageal reflux disease)   . Anxiety    Past Surgical History  Procedure Laterality Date  . Wisdom tooth extraction    . Laparoscopic appendectomy  10/04/2011    Procedure: APPENDECTOMY LAPAROSCOPIC;  Surgeon: Stark Klein, MD;  Location: [facility] OR;  Service: General;  Laterality: N/A;   Family History  Problem Relation Age of Onset  . Adopted: Yes    History  Substance Use Topics  . Smoking status: Never Smoker   . Smokeless tobacco: Not on file  . Alcohol Use: No   OB History   Grav Para Term Preterm Abortions TAB SAB Ect Mult Living                 Review of Systems  Constitutional: Negative for fever, diaphoresis, appetite change, fatigue and unexpected weight change.  HENT: Negative for mouth sores.   Eyes: Negative for visual disturbance.  Respiratory: Positive for chest tightness and shortness of breath. Negative for cough and wheezing.   Cardiovascular: Positive for chest pain.  Gastrointestinal: Negative for nausea, vomiting, abdominal pain, diarrhea and constipation.  Endocrine: Negative for polydipsia, polyphagia and polyuria.  Genitourinary: Negative for dysuria, urgency, frequency and hematuria.  Musculoskeletal: Positive for back pain (L scapula). Negative for neck stiffness.  Skin: Negative for rash.  Allergic/Immunologic: Negative for immunocompromised state.  Neurological: Positive for light-headedness. Negative for syncope and headaches.  Hematological: Does not bruise/bleed easily.  Psychiatric/Behavioral: Negative for sleep disturbance. The patient is not nervous/anxious.     Allergies  Morphine and related and Vicodin  Home Medications   Current Outpatient Rx  Name  Route  Sig  Dispense  Refill  . lansoprazole (PREVACID) 15 MG capsule   Oral   Take 15 mg by mouth daily at 12 noon.         . Prenatal Vit-Min-FA-Fish Oil ([employer] PRENATAL GUMMY) 0.4-113.5 MG CHEW   Oral   Chew 1 tablet by mouth daily.         . ranitidine (ZANTAC) 150  MG capsule   Oral   Take 150 mg by mouth 2 (two) times daily.         Marland Kitchen omeprazole (PRILOSEC) 20 MG capsule   Oral   Take 1 capsule (20 mg total) by mouth daily.   30 capsule   0    BP 150/81  Pulse 76  Temp(Src) 97.8 F (36.6 C) (Oral)  Resp 14  SpO2 100%  LMP 01/08/2013 Physical Exam  Nursing note and vitals reviewed. Constitutional: She is  oriented to person, place, and time. She appears well-developed and well-nourished. No distress.  Awake, alert, nontoxic appearance  HENT:  Head: Normocephalic and atraumatic.  Right Ear: Tympanic membrane, external ear and ear canal normal.  Left Ear: Tympanic membrane, external ear and ear canal normal.  Nose: Nose normal. No mucosal edema or rhinorrhea. No epistaxis. Right sinus exhibits no maxillary sinus tenderness and no frontal sinus tenderness. Left sinus exhibits no maxillary sinus tenderness and no frontal sinus tenderness.  Mouth/Throat: Uvula is midline, oropharynx is clear and moist and mucous membranes are normal. Mucous membranes are not pale and not cyanotic. No oropharyngeal exudate, posterior oropharyngeal edema, posterior oropharyngeal erythema or tonsillar abscesses.  Eyes: Conjunctivae are normal. Pupils are equal, round, and reactive to light. No scleral icterus.  Neck: Normal range of motion and full passive range of motion without pain. Neck supple.  Cardiovascular: Normal rate, regular rhythm, normal heart sounds and intact distal pulses.   No murmur heard. Regular rate and rhythm - rate on my exam 94 bmp  Pulmonary/Chest: Effort normal and breath sounds normal. No stridor. No respiratory distress. She has no wheezes. She has no rales. She exhibits no tenderness.  Course breath sounds throughout, mildly diminished No focal wheezes, Rales or rhonchi  Abdominal: Soft. Bowel sounds are normal. She exhibits no distension and no mass. There is no tenderness. There is no rebound and no guarding.  Abdomen soft and nontender  Musculoskeletal: Normal range of motion. She exhibits no edema and no tenderness.  No peripheral edema No calf tenderness Negative Homans sign  Lymphadenopathy:    She has no cervical adenopathy.  Neurological: She is alert and oriented to person, place, and time. She exhibits normal muscle tone. Coordination normal.  Speech is clear and goal  oriented Moves extremities without ataxia  Skin: Skin is warm and dry. No rash noted. She is not diaphoretic. No erythema.  Psychiatric: She has a normal mood and affect.    ED Course  Procedures (including critical care time) Labs Review Labs Reviewed  COMPREHENSIVE METABOLIC PANEL - Abnormal; Notable for the following:    GFR calc non Af Amer 78 (*)    All other components within normal limits  CBC  LIPASE, BLOOD  TROPONIN I  D-DIMER, QUANTITATIVE   Imaging Review Dg Chest 2 View  01/22/2013   CLINICAL DATA:  Shortness of breath, chest pain  EXAM: CHEST  2 VIEW  COMPARISON:  CTA chest dated 08/16/2011  FINDINGS: Lungs are clear. No pleural effusion or pneumothorax.  The heart is normal in size.  Visualized osseous structures are within normal limits.  IMPRESSION: No evidence of acute cardiopulmonary disease.   Electronically Signed   By: Julian Hy M.D.   On: 01/22/2013 16:12    EKG Interpretation    Date/Time:  Sunday January 22 2013 15:07:47 EST Ventricular Rate:  87 PR Interval:  124 QRS Duration: 82 QT Interval:  532 QTC Calculation: 640 R Axis:   70 Text Interpretation:  Age not entered, assumed to be  33 years old for purpose of ECG interpretation Sinus rhythm Low voltage, precordial leads Nonspecific T abnrm, anterolateral leads Prolonged QT interval t wave abnormality not significantly changed from prior.   Confirmed by [facility]  MD, TREY ([id number]) on 01/22/2013 5:52:15 PM             MDM   1. Atypical chest pain   2. SOB (shortness of breath)   3. GERD (gastroesophageal reflux disease)      Nekeya L Sansom presents with complaints of chest pain and shortness of breath for approximately one week acutely worsening last night and today. She's concerned about MI and PE. We'll test for both. Patient reports no smoking history, exogenous estrogen, long car trips or recent surgery. She denies sick contacts or recent viral symptoms.  She has no history of  asthma. Patient partner reports possible sleep apnea but this has never been confirmed.    5:44 PM Patient given albuterol treatment without change in symptoms. Patient with complete relief after GI cocktail. CBC without leukocytosis, CMP unremarkable, lipase within normal limits, troponin and d-dimer negative. ECG nonischemic and chest x-ray without evidence of pneumothorax, pulmonary edema or pneumonia.  Heart Score - 0 and patient is without risk factors for CAD.    Chest pain is not likely of cardiac or pulmonary etiology d/t presentation, perc negative, VSS, no tracheal deviation, no JVD or new murmur, RRR, breath sounds equal bilaterally, EKG without acute abnormalities, negative troponin, and negative CXR. Pt has been advised start a PPI and return to the ED if CP becomes exertional, associated with diaphoresis or nausea, radiates to left jaw/arm, worsens or becomes concerning in any way. Pt appears reliable for follow up and is agreeable to discharge. Patient is to be discharged with recommendation to follow up with PCP in regards to today's hospital visit.  It has been determined that no acute conditions requiring further emergency intervention are present at this time. The patient/guardian have been advised of the diagnosis and plan. We have discussed signs and symptoms that warrant return to the ED, such as changes or worsening in symptoms.   Vital signs are stable at discharge.   BP 150/81  Pulse 76  Temp(Src) 97.8 F (36.6 C) (Oral)  Resp 14  SpO2 100%  LMP 01/08/2013  Patient/guardian has voiced understanding and agreed to follow-up with the PCP or specialist.      Abigail Butts, PA-C 01/23/13 0004

## 2013-01-22 NOTE — Discharge Instructions (Signed)
1. Medications: omeprazole, usual home medications 2. Treatment: rest, drink plenty of fluids,  3. Follow Up: Please followup with your primary doctor for discussion of your diagnoses and further evaluation after today's visit; if you do not have a primary care doctor use the resource guide provided to find one;   Diet for Gastroesophageal Reflux Disease, Adult Reflux (acid reflux) is when acid from your stomach flows up into the esophagus. When acid comes in contact with the esophagus, the acid causes irritation and soreness (inflammation) in the esophagus. When reflux happens often or so severely that it causes damage to the esophagus, it is called gastroesophageal reflux disease (GERD). Nutrition therapy can help ease the discomfort of GERD. FOODS OR DRINKS TO AVOID OR LIMIT  Smoking or chewing tobacco. Nicotine is one of the most potent stimulants to acid production in the gastrointestinal tract.  Caffeinated and decaffeinated coffee and black tea.  Regular or low-calorie carbonated beverages or energy drinks (caffeine-free carbonated beverages are allowed).   Strong spices, such as black pepper, white pepper, red pepper, cayenne, curry powder, and chili powder.  Peppermint or spearmint.  Chocolate.  High-fat foods, including meats and fried foods. Extra added fats including oils, butter, salad dressings, and nuts. Limit these to less than 8 tsp per day.  Fruits and vegetables if they are not tolerated, such as citrus fruits or tomatoes.  Alcohol.  Any food that seems to aggravate your condition. If you have questions regarding your diet, call your caregiver or a registered dietitian. OTHER THINGS THAT MAY HELP GERD INCLUDE:   Eating your meals slowly, in a relaxed setting.  Eating 5 to 6 small meals per day instead of 3 large meals.  Eliminating food for a period of time if it causes distress.  Not lying down until 3 hours after eating a meal.  Keeping the head of your bed  raised 6 to 9 inches (15 to 23 cm) by using a foam wedge or blocks under the legs of the bed. Lying flat may make symptoms worse.  Being physically active. Weight loss may be helpful in reducing reflux in overweight or obese adults.  Wear loose fitting clothing EXAMPLE MEAL PLAN This meal plan is approximately 2,000 calories based on [email or web address] meal planning guidelines. Breakfast   cup cooked oatmeal.  1 cup strawberries.  1 cup low-fat milk.  1 oz almonds. Snack  1 cup cucumber slices.  6 oz yogurt (made from low-fat or fat-free milk). Lunch  2 slice whole-wheat bread.  2 oz sliced [place].  2 tsp mayonnaise.  1 cup blueberries.  1 cup snap peas. Snack  6 whole-wheat crackers.  1 oz string cheese. Dinner   cup brown rice.  1 cup mixed veggies.  1 tsp olive oil.  3 oz grilled fish. Document Released: 12/29/2004 Document Revised: 03/23/2011 Document Reviewed: 11/14/2010 [facility] Patient Information 2014 [place], [place].

## 2013-01-23 NOTE — ED Provider Notes (Signed)
Medical screening examination/treatment/procedure(s) were performed by non-physician practitioner and as supervising physician I was immediately available for consultation/collaboration.  EKG Interpretation    Date/Time:  Sunday January 22 2013 15:07:47 EST Ventricular Rate:  87 PR Interval:  124 QRS Duration: 82 QT Interval:  532 QTC Calculation: 640 R Axis:   70 Text Interpretation:  Age not entered, assumed to be  33 years old for purpose of ECG interpretation Sinus rhythm Low voltage, precordial leads Nonspecific T abnrm, anterolateral leads Prolonged QT interval t wave abnormality not significantly changed from prior.   Confirmed by [facility]  MD, TREY [phone]) on 01/22/2013 5:52:15 PM              Kristin Siren, MD 01/23/13 1329

## 2013-05-06 ENCOUNTER — Emergency Department (INDEPENDENT_AMBULATORY_CARE_PROVIDER_SITE_OTHER): Payer: 59

## 2013-05-06 ENCOUNTER — Encounter (HOSPITAL_COMMUNITY): Payer: Self-pay | Admitting: Emergency Medicine

## 2013-05-06 ENCOUNTER — Emergency Department (HOSPITAL_COMMUNITY)
Admission: EM | Admit: 2013-05-06 | Discharge: 2013-05-06 | Disposition: A | Discharge status: Home / Self Care | Payer: 59 | Source: Home / Self Care | Attending: Family Medicine | Admitting: Family Medicine

## 2013-05-06 DIAGNOSIS — R079 Chest pain, unspecified: Secondary | ICD-10-CM

## 2013-05-06 DIAGNOSIS — I491 Atrial premature depolarization: Secondary | ICD-10-CM

## 2013-05-06 DIAGNOSIS — K219 Gastro-esophageal reflux disease without esophagitis: Secondary | ICD-10-CM

## 2013-05-06 MED ORDER — GI COCKTAIL ~~LOC~~
ORAL | Status: AC
  Filled 2013-05-06: qty 30

## 2013-05-06 MED ORDER — GI COCKTAIL ~~LOC~~
30.0000 mL | Freq: Once | ORAL | Status: AC
  Administered 2013-05-06: 30 mL via ORAL

## 2013-05-06 NOTE — ED Provider Notes (Signed)
Medical screening examination/treatment/procedure(s) were performed by resident physician or non-physician practitioner and as supervising physician I was immediately available for consultation/collaboration.   Pauline Good MD.   Billy Fischer, MD 05/06/13 [phone]

## 2013-05-06 NOTE — ED Notes (Signed)
C/o sob and chest pain States she does have chronic reflux Admits to having tingling in hands for a few days Chest pain has been for two days   Has tried acid reflux med

## 2013-05-06 NOTE — Discharge Instructions (Signed)
Gastroesophageal Reflux Disease, Adult Gastroesophageal reflux disease (GERD) happens when acid from your stomach goes into your food pipe (esophagus). The acid can cause a burning feeling in your chest. Over time, the acid can make small holes (ulcers) in your food pipe.  HOME CARE  Ask your doctor for advice about:  Losing weight.  Quitting smoking.  Alcohol use.  Avoid foods and drinks that make your problems worse. You may want to avoid:  Caffeine and alcohol.  Chocolate.  Mints.  Garlic and onions.  Spicy foods.  Citrus fruits, such as oranges, lemons, or limes.  Foods that contain tomato, such as sauce, chili, salsa, and pizza.  Fried and fatty foods.  Avoid lying down for 3 hours before you go to bed or before you take a nap.  Eat small meals often, instead of large meals.  Wear loose-fitting clothing. Do not wear anything tight around your waist.  Raise (elevate) the head of your bed 6 to 8 inches with wood blocks. Using extra pillows does not help.  Only take medicines as told by your doctor.  Do not take aspirin or ibuprofen. GET HELP RIGHT AWAY IF:   You have pain in your arms, neck, jaw, teeth, or back.  Your pain gets worse or changes.  You feel sick to your stomach (nauseous), throw up (vomit), or sweat (diaphoresis).  You feel short of breath, or you pass out (faint).  Your throw up is green, yellow, black, or looks like coffee grounds or blood.  Your poop (stool) is red, bloody, or black. MAKE SURE YOU:   Understand these instructions.  Will watch your condition.  Will get help right away if you are not doing well or get worse. Document Released: 06/17/2007 Document Revised: 03/23/2011 Document Reviewed: 07/18/2010 [facility] Patient Information 2014 [place], [place].   Exam good, EKG- and CXR normal. Symptoms most consistent with prolonged and uncontrolled Reflux disease. For now continue Prilosec at night and Zantac in the am,  consistently until follow up with PCP. Ultimately may need GI referral. Nice to meet you. F/U with PCP for this and findings on EKG of premature atrial contractures (normal)

## 2013-05-06 NOTE — ED Provider Notes (Signed)
CSN: [id number]     Arrival date & time 05/06/13  0905 History   First MD Initiated Contact with Patient 05/06/13 0913     Chief Complaint  Patient presents with  . Chest Pain  . Shortness of Breath   (Consider location/radiation/quality/duration/timing/severity/associated sxs/prior Treatment) HPI Comments: Patient presents today with chest "burning" and feelings of SOB that began 2 days ago. The events are usually post pradial and not with activity. The burning radiates up into the neck along the sternum. When she takes Zantac the pain and SOB feelings are relieved. She denies radiation to her arm. No diaphoreses. No nausea. No palpitations. No known history of cardiac or pulmonary issues. Known reflux in the past that has been quite bothersome.    Patient is a 33 y.o. female presenting with chest pain and shortness of breath. The history is provided by the patient.  Chest Pain Associated symptoms: abdominal pain and shortness of breath   Associated symptoms: no nausea and not vomiting   Shortness of Breath Associated symptoms: abdominal pain and chest pain   Associated symptoms: no vomiting and no wheezing     Past Medical History  Diagnosis Date  . IBS (irritable bowel syndrome)     diarrhea prone  . GERD (gastroesophageal reflux disease)   . Anxiety    Past Surgical History  Procedure Laterality Date  . Wisdom tooth extraction    . Laparoscopic appendectomy  10/04/2011    Procedure: APPENDECTOMY LAPAROSCOPIC;  Surgeon: Stark Klein, MD;  Location: [facility] OR;  Service: General;  Laterality: N/A;   Family History  Problem Relation Age of Onset  . Adopted: Yes   History  Substance Use Topics  . Smoking status: Never Smoker   . Smokeless tobacco: Not on file  . Alcohol Use: No   OB History   Grav Para Term Preterm Abortions TAB SAB Ect Mult Living                 Review of Systems  HENT: Negative.   Respiratory: Positive for shortness of breath. Negative for apnea, chest  tightness, wheezing and stridor.   Cardiovascular: Positive for chest pain.  Gastrointestinal: Positive for abdominal pain. Negative for nausea, vomiting, diarrhea, constipation and rectal pain.  Endocrine: Negative.   Musculoskeletal: Negative.   Skin: Negative.   Allergic/Immunologic: Negative.   Neurological: Negative.     Allergies  Morphine and related and Vicodin  Home Medications   Prior to Admission medications   Medication Sig Start Date End Date Taking? Authorizing Provider  lansoprazole (PREVACID) 15 MG capsule Take 15 mg by mouth daily at 12 noon.    Historical Provider, MD  omeprazole (PRILOSEC) 20 MG capsule Take 1 capsule (20 mg total) by mouth daily. 01/22/13   Hannah Muthersbaugh, PA-C  Prenatal Vit-Min-FA-Fish Oil ([facility] PRENATAL GUMMY) 0.4-113.5 MG CHEW Chew 1 tablet by mouth daily.    Historical Provider, MD  ranitidine (ZANTAC) 150 MG capsule Take 150 mg by mouth 2 (two) times daily.    Historical Provider, MD   BP 146/93  Pulse 70  Temp(Src) 98.5 F (36.9 C) (Oral)  Resp 18  SpO2 100%  LMP 04/26/2013 Physical Exam  Constitutional: She is oriented to person, place, and time. She appears well-developed and well-nourished. No distress.  HENT:  Head: Normocephalic and atraumatic.  Neck: Normal range of motion.  Cardiovascular: Normal rate, regular rhythm and normal heart sounds.  Exam reveals no gallop and no friction rub.   No murmur heard. Pulmonary/Chest:  Effort normal and breath sounds normal. No respiratory distress. She has no wheezes. She has no rales. She exhibits no tenderness.  Abdominal: Soft. Bowel sounds are normal. She exhibits no distension. There is tenderness.  Mid upper quadrant to palpation just inferior to xiphoid process  Neurological: She is alert and oriented to person, place, and time.  Skin: Skin is warm and dry. She is not diaphoretic.  Psychiatric: Her behavior is normal.    ED Course  ED EKG  Date/Time: 05/06/2013 10:35  AM Performed by: Shareeka Yim G Authorized by: Bjorn Pippin Interpreted by ED physician Comparison: not compared with previous ECG  Previous ECG: no previous ECG available Rhythm: sinus rhythm Rhythm comments: PAC Rate: normal Conduction: conduction normal ST Segments: ST segments normal T Waves: T waves normal Comments: Premature Atrial complexes    (including critical care time) Labs Review Labs Reviewed - No data to display  Imaging Review Dg Chest 2 View  05/06/2013   CLINICAL DATA:  Chest pain with shortness of breath  EXAM: CHEST  2 VIEW  COMPARISON:  DG CHEST 2 VIEW dated 01/22/2013  FINDINGS: The heart size and mediastinal contours are within normal limits. Both lungs are clear. The visualized skeletal structures are unremarkable.  IMPRESSION: No active cardiopulmonary disease.   Electronically Signed   By: Skipper Cliche M.D.   On: 05/06/2013 10:15     MDM   1. GERD (gastroesophageal reflux disease)   2. Chest pain   3. PAC (premature atrial contraction)   PAin and dyspnea improved with Zantac and mild with GI cocktail. No cardiac or pulmonary etiologies found. Suggest Prilosec has and zantac am, with f/u with PCP and ultimately a GI referral.     Bjorn Pippin, PA-C 05/06/13 1058

## 2013-05-08 ENCOUNTER — Ambulatory Visit (INDEPENDENT_AMBULATORY_CARE_PROVIDER_SITE_OTHER): Payer: 59 | Admitting: Internal Medicine

## 2013-05-08 ENCOUNTER — Other Ambulatory Visit (INDEPENDENT_AMBULATORY_CARE_PROVIDER_SITE_OTHER): Payer: 59

## 2013-05-08 ENCOUNTER — Encounter: Payer: Self-pay | Admitting: Internal Medicine

## 2013-05-08 VITALS — BP 120/78 | HR 65 | Temp 96.9°F | Resp 13 | Wt 180.4 lb

## 2013-05-08 DIAGNOSIS — K219 Gastro-esophageal reflux disease without esophagitis: Secondary | ICD-10-CM

## 2013-05-08 LAB — CBC WITH DIFFERENTIAL/PLATELET
BASOS PCT: 0.6 % (ref 0.0–3.0)
Basophils Absolute: 0.1 10*3/uL (ref 0.0–0.1)
EOS PCT: 0.9 % (ref 0.0–5.0)
Eosinophils Absolute: 0.1 10*3/uL (ref 0.0–0.7)
HCT: 42.3 % (ref 36.0–46.0)
Hemoglobin: 14.2 g/dL (ref 12.0–15.0)
LYMPHS PCT: 29 % (ref 12.0–46.0)
Lymphs Abs: 2.5 10*3/uL (ref 0.7–4.0)
MCHC: 33.6 g/dL (ref 30.0–36.0)
MCV: 78.7 fl (ref 78.0–100.0)
MONOS PCT: 6.3 % (ref 3.0–12.0)
Monocytes Absolute: 0.5 10*3/uL (ref 0.1–1.0)
NEUTROS PCT: 63.2 % (ref 43.0–77.0)
Neutro Abs: 5.4 10*3/uL (ref 1.4–7.7)
PLATELETS: 188 10*3/uL (ref 150.0–400.0)
RBC: 5.38 Mil/uL — AB (ref 3.87–5.11)
RDW: 13.5 % (ref 11.5–14.6)
WBC: 8.5 10*3/uL (ref 4.5–10.5)

## 2013-05-08 MED ORDER — OMEPRAZOLE 20 MG PO CPDR: 20.0000 mg | DELAYED_RELEASE_CAPSULE | Freq: Two times a day (BID) | ORAL | Status: DC

## 2013-05-08 NOTE — Patient Instructions (Signed)
Reflux of gastric acid may be asymptomatic as this may occur mainly during sleep.The triggers for reflux  include stress; the "aspirin family" ; alcohol; peppermint; and caffeine (coffee, tea, cola, and chocolate). The aspirin family would include aspirin and the nonsteroidal agents such as ibuprofen &  Naproxen. Tylenol would not cause reflux. If having symptoms ; food & drink should be avoided for @ least 2 hours before going to bed.  Take the protein pump inhibitor 30 minutes before breakfast and 30 minutes before the evening meal.After 8 weeks; go back to once a day  30 minutes before breakfast.

## 2013-05-08 NOTE — Progress Notes (Signed)
   Subjective:    Patient ID: Kristin [name], female    DOB: 1980-06-27, 33 y.o.   MRN: [id number]  HPI  She's had GI symptoms intermittently since December 2014. There was an exacerbation 05/04/13. She describes having variable discomfort in the upper abdomen varying from the total-burning-sharp and lasting 1-2 hours.  She's had associated heartburn and weight loss of 10 pounds. She has recurrent nausea and loose stools. She has not had melena or rectal bleeding .The severe heartburn will be associated with dyspnea one half-1 hour after eating. She feels that the ranitidine does help the dyspnea.  She used Prilosec OTC for 3 weeks in February-March with some improvement. After stopping it the symptoms quickly recurred  She has added Zantac 150 mg twice a day but continues to have symptoms  Triggers include fried foods, caffeine, chocolate, milk, and spicy foods.  Additionally she's been ingesting nonsteroidals typically every other day.  She rarely drinks and is restricting caffeine. She does not smoke.    Review of Systems  She specifically denies dysphagia, anorexia, hematemesis, fever, chills, sweats, dysuria, pyuria, or hematuria.  She's had no back pain which radiates to the epigastrium.      Objective:   Physical Exam General appearance is one of good health and nourishment w/o distress.  Eyes: No conjunctival inflammation or scleral icterus is present.  Oral exam: Dental hygiene is good; lips and gums are healthy appearing.There is no oropharyngeal erythema or exudate noted.   Heart:  Normal rate and regular rhythm. S1 and S2 normal without gallop, murmur, click, rub or other extra sounds     Lungs:Chest clear to auscultation; no wheezes, rhonchi,rales ,or rubs present.No increased work of breathing.   Abdomen: bowel sounds normal, soft  But slightly tender in epigastrium  without masses, organomegaly or hernias noted.  No guarding or rebound . No tenderness over the  flanks to percussion  Musculoskeletal: Able to lie flat and sit up without help. Negative straight leg raising bilaterally. Gait normal  Skin:Warm & dry.  Intact without suspicious lesions or rashes ; no jaundice or tenting  Lymphatic: No lymphadenopathy is noted about the head, neck, axilla                Assessment & Plan:  #1 GERD, severe See Orders

## 2013-05-08 NOTE — Progress Notes (Signed)
Pre visit review using our clinic review tool, if applicable. No additional management support is needed unless otherwise documented below in the visit note. 

## 2013-05-16 ENCOUNTER — Telehealth: Payer: Self-pay

## 2013-05-16 NOTE — Telephone Encounter (Signed)
Prior authorization for Omeprazole 20 mg take twice daily was submitted yesterday to patient's insurance. This request has been approved effective 05/15/2013 to 05/16/2014. This information has been faxed to [facility] [phone].

## 2014-01-14 ENCOUNTER — Emergency Department (HOSPITAL_COMMUNITY)
Admission: EM | Admit: 2014-01-14 | Discharge: 2014-01-14 | Disposition: A | Discharge status: Home / Self Care | Payer: 59 | Source: Home / Self Care

## 2014-01-14 ENCOUNTER — Encounter (HOSPITAL_COMMUNITY): Payer: Self-pay | Admitting: *Deleted

## 2014-01-14 DIAGNOSIS — J069 Acute upper respiratory infection, unspecified: Secondary | ICD-10-CM

## 2014-01-14 MED ORDER — IPRATROPIUM BROMIDE 0.06 % NA SOLN: 2.0000 | Freq: Four times a day (QID) | NASAL | Status: DC

## 2014-01-14 MED ORDER — AMOXICILLIN-POT CLAVULANATE 875-125 MG PO TABS: 1.0000 | ORAL_TABLET | Freq: Two times a day (BID) | ORAL | Status: DC

## 2014-01-14 MED ORDER — BENZONATATE 200 MG PO CAPS: 200.0000 mg | ORAL_CAPSULE | Freq: Three times a day (TID) | ORAL | Status: DC | PRN

## 2014-01-14 NOTE — ED Provider Notes (Signed)
   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 She's had a three-day history of cough productive of clear sputum, headache, sinus pressure, and nasal congestion with bloody drainage. She's also had hoarseness, sore throat, fever to 100, aches, and chills. She denies any wheezing, chest pain, or GI symptoms. No known sick exposures.  Review of Systems   Other than as noted above, the patient denies any of the following symptoms: Systemic:  No fevers, chills, sweats, or myalgias. Eye:  No redness or discharge. ENT:  No ear pain, headache, nasal congestion, drainage, sinus pressure, or sore throat. Neck:  No neck pain, stiffness, or swollen glands. Lungs:  No cough, sputum production, hemoptysis, wheezing, chest tightness, shortness of breath or chest pain. GI:  No abdominal pain, nausea, vomiting or diarrhea.  [place]   Past medical history, family history, social history, meds, and allergies were reviewed.   Physical exam   Vital signs:  BP 112/74 mmHg  Temp(Src) 98.6 F (37 C) (Oral)  Resp 18  SpO2 98%  LMP 12/26/2013 General:  Alert and oriented.  In no distress.  Skin warm and dry. Eye:  No conjunctival injection or drainage. Lids were normal. ENT:  TMs and canals were normal, without erythema or inflammation.  Nasal mucosa was clear and uncongested, without drainage.  Mucous membranes were moist.  Pharynx was clear with no exudate or drainage.  There were no oral ulcerations or lesions. Neck:  Supple, no adenopathy, tenderness or mass. Lungs:  No respiratory distress.  Lungs were clear to auscultation, without wheezes, rales or rhonchi.  Breath sounds were clear and equal bilaterally.  Heart:  Regular rhythm, without gallops, murmers or rubs. Skin:  Clear, warm, and dry, without rash or lesions.  Assessment     The encounter diagnosis was Viral URI.  There is no evidence of pneumonia, strep  throat, sinusitis, otitis media.    Plan    1.  Meds:  The following meds were prescribed:   Discharge Medication List as of 01/14/2014  4:38 PM    START taking these medications   Details  amoxicillin-clavulanate (AUGMENTIN) 875-125 MG per tablet Take 1 tablet by mouth 2 (two) times daily., Starting 01/14/2014, Until Discontinued, Print    benzonatate (TESSALON) 200 MG capsule Take 1 capsule (200 mg total) by mouth 3 (three) times daily as needed for cough., Starting 01/14/2014, Until Discontinued, Normal    ipratropium (ATROVENT) 0.06 % nasal spray Place 2 sprays into both nostrils 4 (four) times daily., Starting 01/14/2014, Until Discontinued, Normal        2.  Patient Education/Counseling:  The patient was given appropriate handouts, self care instructions, and instructed in symptomatic relief.  Instructed to get extra fluids and extra rest.  She was told not to get the prescription from the buttocks filled unless she wasn't getting any better in 3 or 4 days.  3.  Follow up:  The patient was told to follow up here if no better in 3 to 4 days, or sooner if becoming worse in any way, and given some red flag symptoms such as increasing fever, difficulty breathing, chest pain, or persistent vomiting which would prompt immediate return.       Harden Mo, MD 01/14/14 [phone]

## 2014-01-14 NOTE — ED Notes (Signed)
Pt  Reports  Symptoms  Of  Nasal  Congestion      With  A    Cough  And  Low grade  Fever      X 5  Days       Pt  Sitting   Upright  On  Exam    Table    Speaking  In  Exam table      Speaking  In  Complete  sentances

## 2014-01-14 NOTE — Discharge Instructions (Signed)

## 2014-10-09 IMAGING — CR DG CHEST 2V
2 series · 2 of 2 positions shown · non-contrast
Comparison: CTA chest dated 08/16/2011

CLINICAL DATA: Shortness of breath, chest pain

EXAM:
CHEST  2 VIEW

[w chest pa]
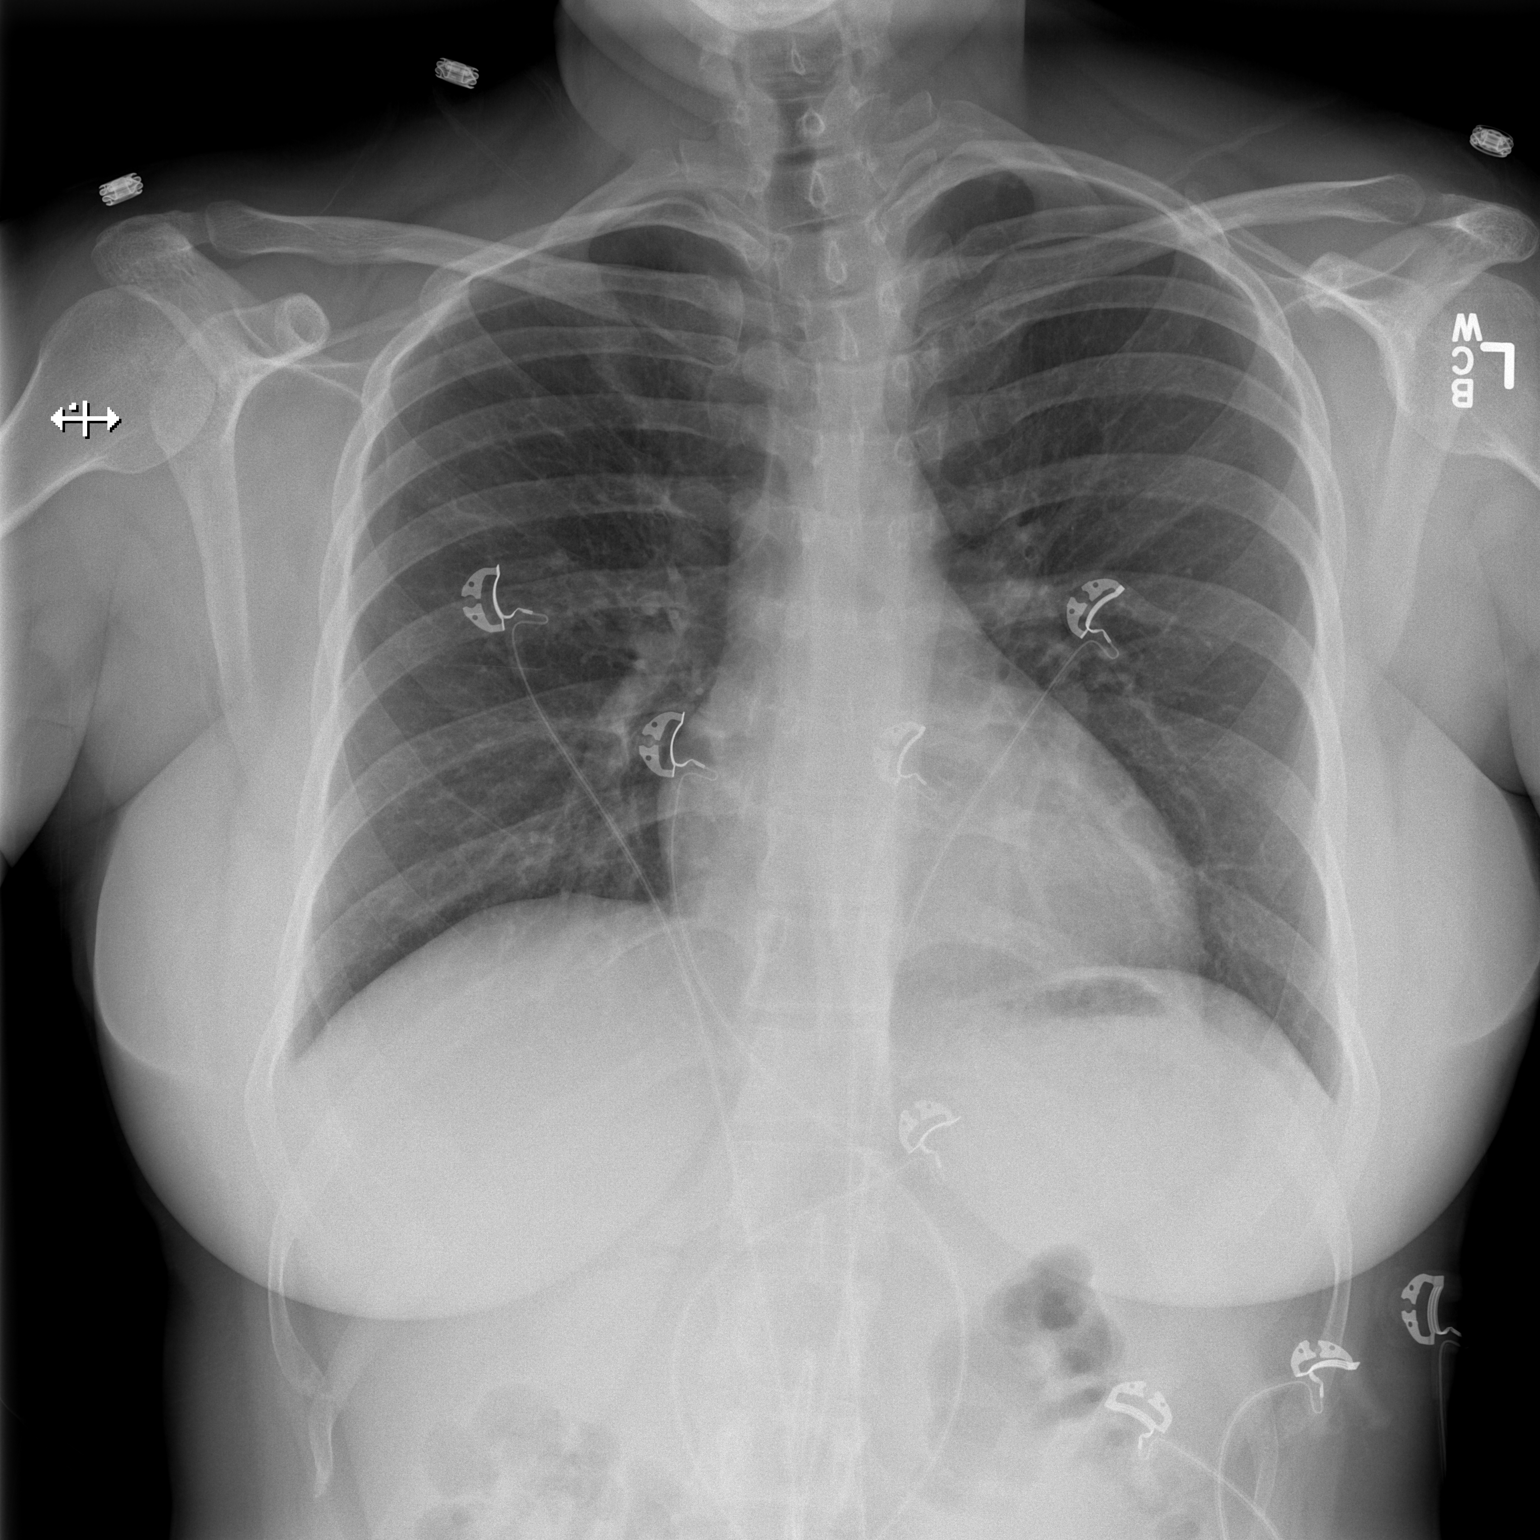

[w chest lat]
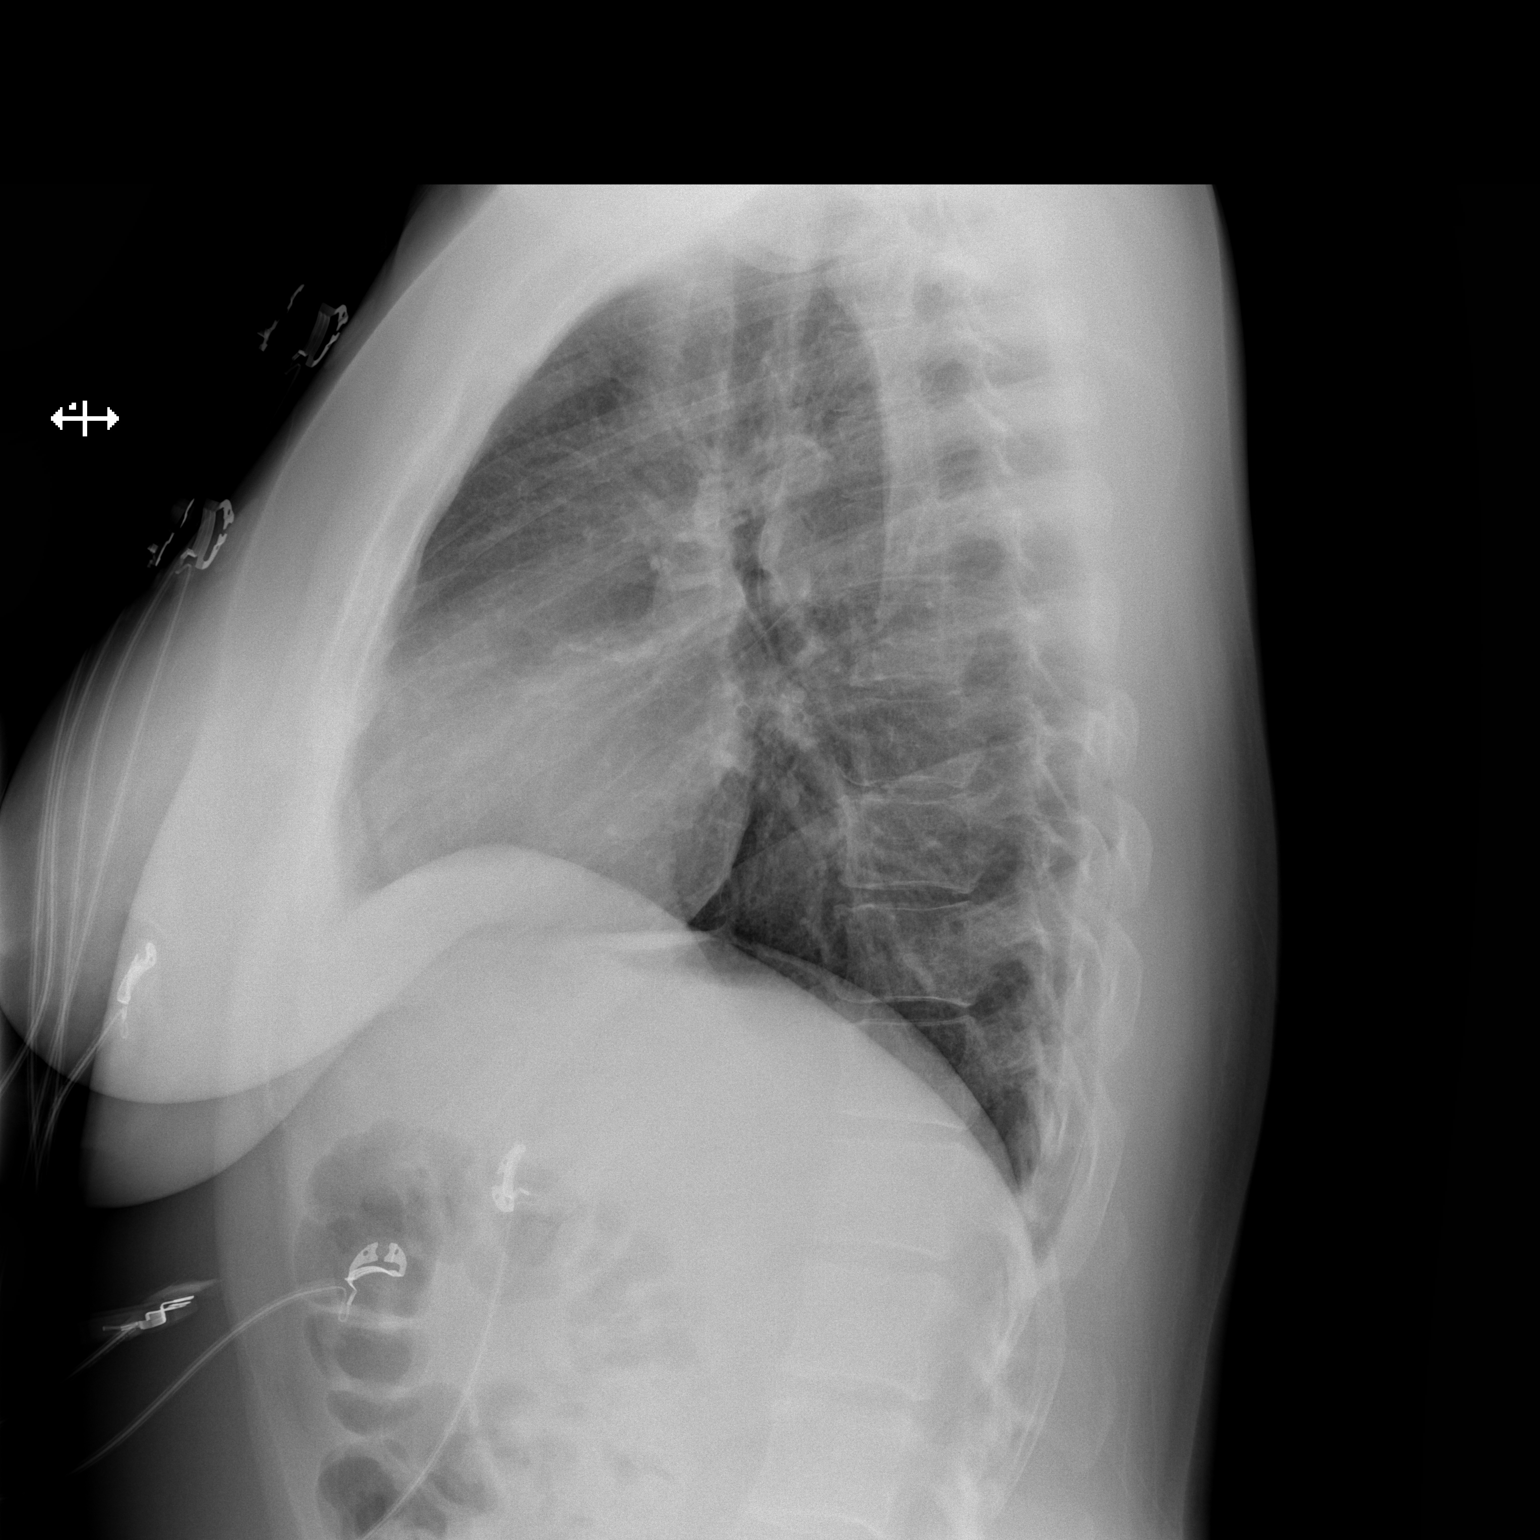

[2 of 2 positions shown; findings below may reference images not displayed]

FINDINGS: Lungs are clear. No pleural effusion or pneumothorax.

The heart is normal in size.

Visualized osseous structures are within normal limits.
IMPRESSION: No evidence of acute cardiopulmonary disease.

## 2015-01-21 IMAGING — CR DG CHEST 2V
2 series · 2 of 2 positions shown · non-contrast
Comparison: DG CHEST 2 VIEW dated 01/22/2013

CLINICAL DATA: Chest pain with shortness of breath

EXAM:
CHEST  2 VIEW

[view not recorded (1 of 2)]
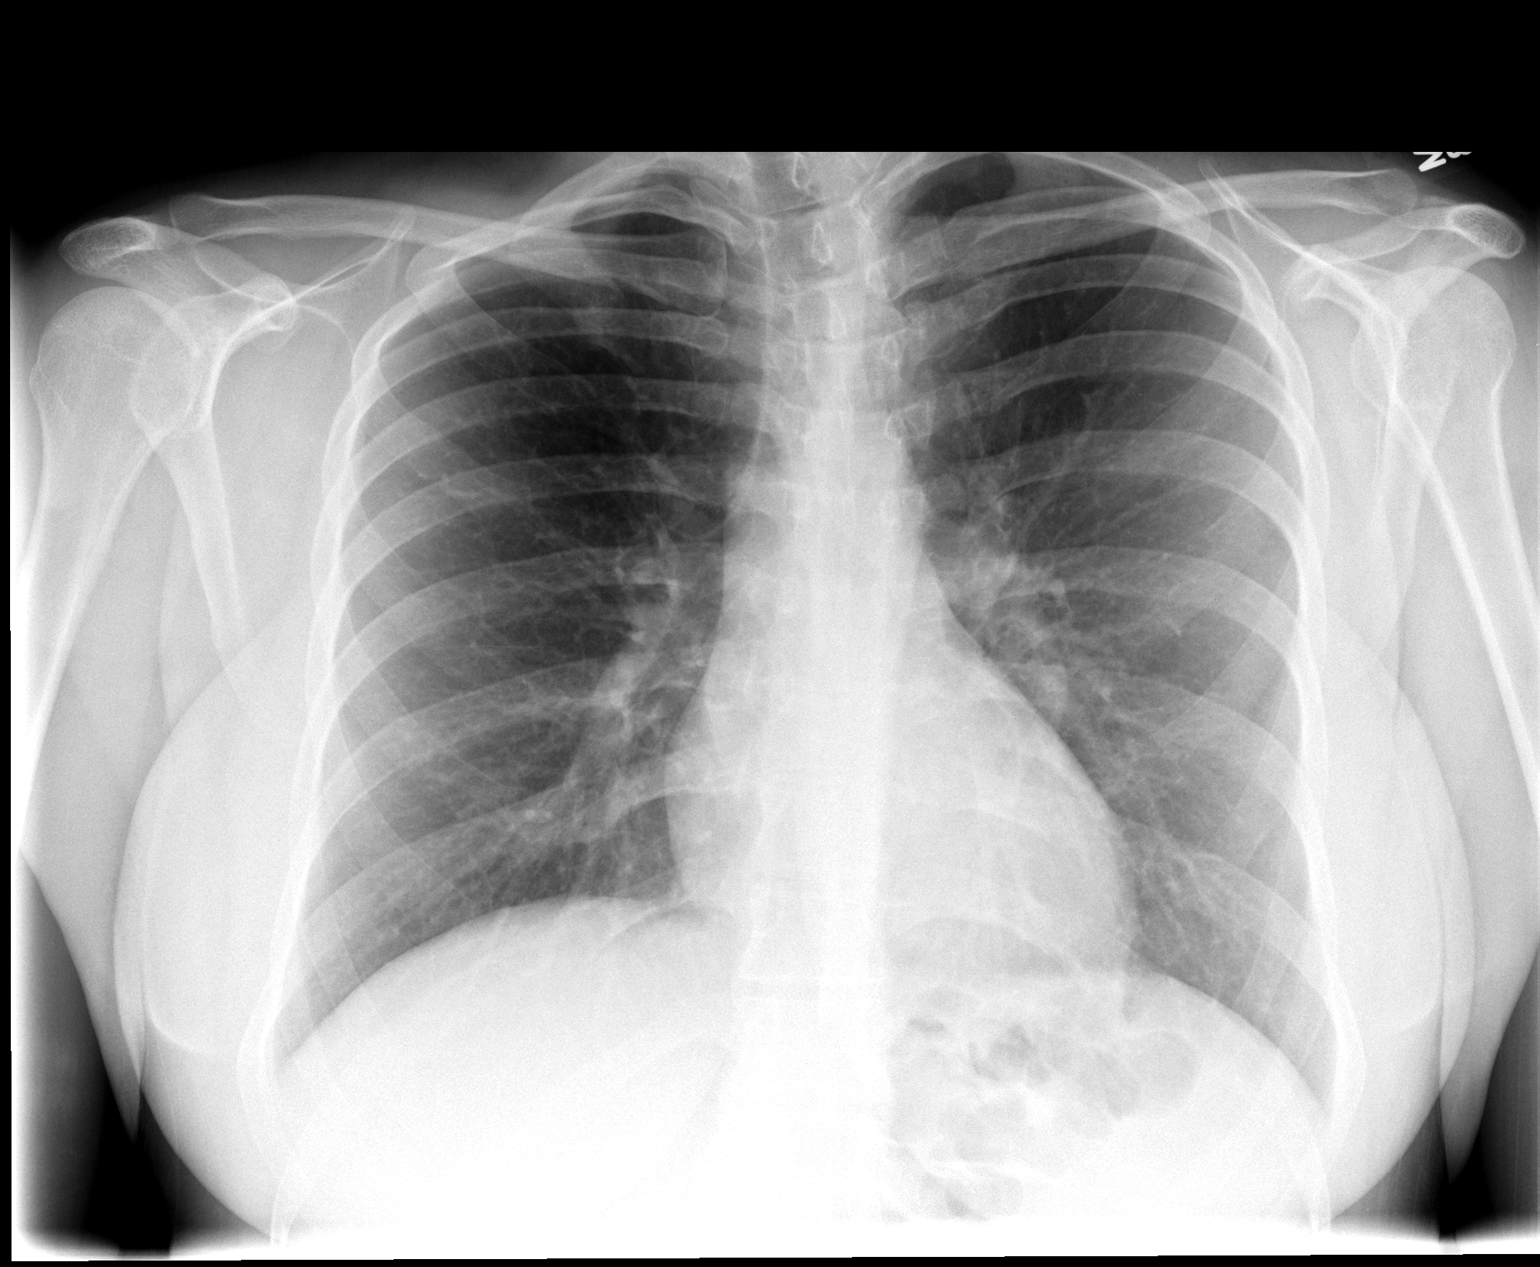

[view not recorded (2 of 2)]
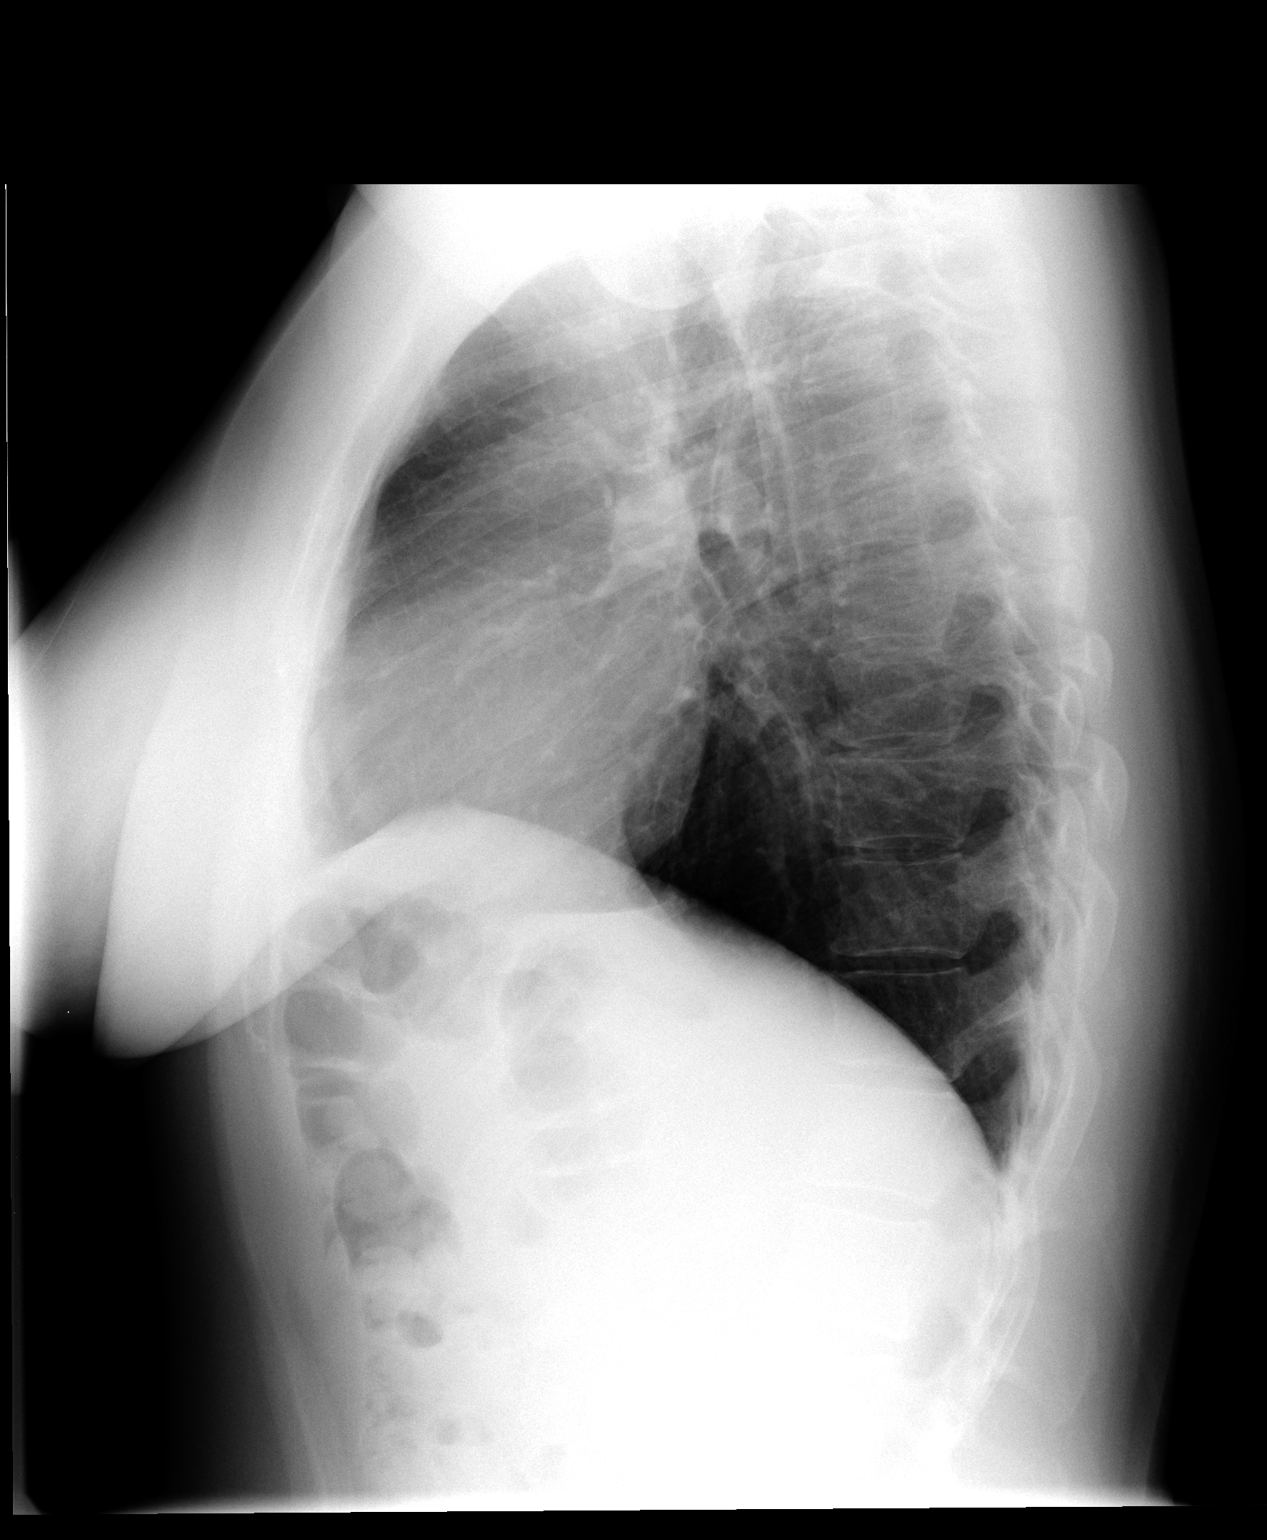

[2 of 2 positions shown; findings below may reference images not displayed]

FINDINGS: The heart size and mediastinal contours are within normal limits.
Both lungs are clear. The visualized skeletal structures are
unremarkable.
IMPRESSION: No active cardiopulmonary disease.

## 2015-08-02 DIAGNOSIS — J029 Acute pharyngitis, unspecified: Secondary | ICD-10-CM | POA: Diagnosis not present

## 2015-08-02 DIAGNOSIS — J069 Acute upper respiratory infection, unspecified: Secondary | ICD-10-CM | POA: Diagnosis not present

## 2015-08-05 DIAGNOSIS — H5213 Myopia, bilateral: Secondary | ICD-10-CM | POA: Diagnosis not present

## 2016-01-02 ENCOUNTER — Other Ambulatory Visit: Payer: Self-pay | Admitting: Obstetrics and Gynecology

## 2016-01-02 DIAGNOSIS — Z6832 Body mass index (BMI) 32.0-32.9, adult: Secondary | ICD-10-CM | POA: Diagnosis not present

## 2016-01-02 DIAGNOSIS — Z124 Encounter for screening for malignant neoplasm of cervix: Secondary | ICD-10-CM | POA: Diagnosis not present

## 2016-01-02 DIAGNOSIS — Z01419 Encounter for gynecological examination (general) (routine) without abnormal findings: Secondary | ICD-10-CM | POA: Diagnosis not present

## 2016-01-03 LAB — CYTOLOGY - PAP

## 2016-07-27 ENCOUNTER — Telehealth: Payer: Self-pay | Admitting: General Practice

## 2016-10-08 DIAGNOSIS — S41101A Unspecified open wound of right upper arm, initial encounter: Secondary | ICD-10-CM | POA: Diagnosis not present

## 2017-02-15 NOTE — Telephone Encounter (Signed)
Error..[facility] °

## 2017-07-02 ENCOUNTER — Ambulatory Visit (INDEPENDENT_AMBULATORY_CARE_PROVIDER_SITE_OTHER): Payer: 59 | Admitting: Nurse Practitioner

## 2017-07-02 ENCOUNTER — Encounter: Payer: Self-pay | Admitting: Nurse Practitioner

## 2017-07-02 ENCOUNTER — Other Ambulatory Visit (INDEPENDENT_AMBULATORY_CARE_PROVIDER_SITE_OTHER): Payer: 59

## 2017-07-02 VITALS — BP 122/82 | HR 66 | Temp 98.4°F | Resp 16 | Ht 65.0 in | Wt 186.0 lb

## 2017-07-02 DIAGNOSIS — Z13220 Encounter for screening for lipoid disorders: Secondary | ICD-10-CM

## 2017-07-02 DIAGNOSIS — E6609 Other obesity due to excess calories: Secondary | ICD-10-CM

## 2017-07-02 DIAGNOSIS — Z6830 Body mass index (BMI) 30.0-30.9, adult: Secondary | ICD-10-CM

## 2017-07-02 DIAGNOSIS — Z0001 Encounter for general adult medical examination with abnormal findings: Secondary | ICD-10-CM

## 2017-07-02 DIAGNOSIS — R7309 Other abnormal glucose: Secondary | ICD-10-CM | POA: Diagnosis not present

## 2017-07-02 DIAGNOSIS — Z114 Encounter for screening for human immunodeficiency virus [HIV]: Secondary | ICD-10-CM

## 2017-07-02 DIAGNOSIS — F419 Anxiety disorder, unspecified: Secondary | ICD-10-CM

## 2017-07-02 DIAGNOSIS — I499 Cardiac arrhythmia, unspecified: Secondary | ICD-10-CM | POA: Diagnosis not present

## 2017-07-02 LAB — LIPID PANEL
CHOLESTEROL: 205 mg/dL — AB (ref 0–200)
HDL: 42.7 mg/dL (ref >39.00)
LDL Cholesterol: 127 mg/dL — ABNORMAL HIGH (ref 0–99)
NonHDL: 162.77
Total CHOL/HDL Ratio: 5
Triglycerides: 181 mg/dL — ABNORMAL HIGH (ref 0.0–149.0)
VLDL: 36.2 mg/dL (ref 0.0–40.0)

## 2017-07-02 LAB — CBC
HCT: 44.6 % (ref 36.0–46.0)
Hemoglobin: 14.6 g/dL (ref 12.0–15.0)
MCHC: 32.9 g/dL (ref 30.0–36.0)
MCV: 81.5 fl (ref 78.0–100.0)
PLATELETS: 184 10*3/uL (ref 150.0–400.0)
RBC: 5.47 Mil/uL — ABNORMAL HIGH (ref 3.87–5.11)
RDW: 13.4 % (ref 11.5–15.5)
WBC: 9.4 10*3/uL (ref 4.0–10.5)

## 2017-07-02 LAB — COMPREHENSIVE METABOLIC PANEL
ALT: 9 U/L (ref 0–35)
AST: 14 U/L (ref 0–37)
Albumin: 4.5 g/dL (ref 3.5–5.2)
Alkaline Phosphatase: 66 U/L (ref 39–117)
BILIRUBIN TOTAL: 0.5 mg/dL (ref 0.2–1.2)
BUN: 12 mg/dL (ref 6–23)
CO2: 27 mEq/L (ref 19–32)
CREATININE: 0.83 mg/dL (ref 0.40–1.20)
Calcium: 9.4 mg/dL (ref 8.4–10.5)
Chloride: 103 mEq/L (ref 96–112)
GFR: 82.59 mL/min (ref >60.00)
Glucose, Bld: 94 mg/dL (ref 70–99)
Potassium: 4.2 mEq/L (ref 3.5–5.1)
Sodium: 137 mEq/L (ref 135–145)
Total Protein: 7.3 g/dL (ref 6.0–8.3)

## 2017-07-02 LAB — HEMOGLOBIN A1C: HEMOGLOBIN A1C: 5.6 % (ref 4.6–6.5)

## 2017-07-02 LAB — TSH: TSH: 1.64 u[IU]/mL (ref 0.35–4.50)

## 2017-07-02 MED ORDER — ESCITALOPRAM OXALATE 10 MG PO TABS: 10.0000 mg | ORAL_TABLET | Freq: Every day | ORAL | 1 refills | Status: DC

## 2017-07-02 NOTE — Progress Notes (Signed)
Name: Kristin [name]   MRN: [id number]    DOB: 1980/09/29   Date:07/04/2017       Progress Note  Subjective  Chief Complaint  Chief Complaint  Patient presents with  . Establish Care    CPE, fasting    HPI Ms Scales is here today to establish care, was a prior patient in this practice in the past, last seen in 2015. She is not maintained on any daily medications. She is also following with GYN for routine womens health care Patient presents for annual CPE.  Diet, Exercise: actively trying to lose weight, has been more active- running, biking- lost 15 lbs  USPSTF grade A and B recommendations  Depression:  Denies depression. Reports long history of anxiety- feels she excessively worries about things she can not control, sometimes can not sleep at night because she is afraid she might die in her sleep. Was given xanax for her anxiety in the past but did not like the way it made her feel, has not been on other medications for anxiety in the past. Has had thoughts of hurting herself in the past, thought that she "would be better off dead", but denies thoughts of hurting others and denies recent SI Depression screen [facility] 2/9 07/02/2017  Decreased Interest 0  Down, Depressed, Hopeless 0  PHQ - 2 Score 0  Altered sleeping 0  Tired, decreased energy 0  Change in appetite 0  Feeling bad or failure about yourself  0  Trouble concentrating 0  Moving slowly or fidgety/restless 0  Suicidal thoughts 0  PHQ-9 Score 0   Hypertension: BP Readings from Last 3 Encounters:  07/02/17 122/82  01/14/14 112/74  05/08/13 120/78   Obesity: Wt Readings from Last 3 Encounters:  07/02/17 186 lb (84.4 kg)  05/08/13 180 lb 6.4 oz (81.8 kg)  08/31/12 192 lb (87.1 kg)   BMI Readings from Last 3 Encounters:  07/02/17 30.95 kg/m  05/08/13 30.02 kg/m  08/31/12 31.95 kg/m    Alcohol: rare Tobacco use: no, never  HIV: screening ordered today STD testing and prevention (chl/gon/syphilis):  No  concerns, declines  Intimate partner violence: denies, feels safe  Vaccinations: up to date  Advanced Care Planning: A voluntary discussion about advance care planning including the explanation and discussion of advance directives.  Discussed health care proxy and Living will, and the patient DOES NOT have a living will at present time. If patient does have living will, I have requested they bring this to the clinic to be scanned in to their chart.  Cervical cancer screening: follows with GYN, Dr Philis Pique, for routine womens health care, PAP up to date  Lipids: lipid panel today Lab Results  Component Value Date   CHOL 205 (H) 07/02/2017   CHOL 173 08/31/2012   CHOL 218 (H) 07/21/2011   Lab Results  Component Value Date   HDL 42.70 07/02/2017   HDL 32.40 (L) 08/31/2012   HDL 44.80 07/21/2011   Lab Results  Component Value Date   LDLCALC 127 (H) 07/02/2017   Lab Results  Component Value Date   TRIG 181.0 (H) 07/02/2017   TRIG 245.0 (H) 08/31/2012   TRIG 201.0 (H) 07/21/2011   Lab Results  Component Value Date   CHOLHDL 5 07/02/2017   CHOLHDL 5 08/31/2012   CHOLHDL 5 07/21/2011   Lab Results  Component Value Date   LDLDIRECT 106.8 08/31/2012   LDLDIRECT 137.0 07/21/2011    Glucose: a1c today Glucose, Bld  Date Value Ref  Range Status  07/02/2017 94 70 - 99 mg/dL Final  01/22/2013 88 70 - 99 mg/dL Final  08/31/2012 97 70 - 99 mg/dL Final    Skin cancer: routinely wears sunscreen    Aspirin: not indicated ECG: not indicated   Patient Active Problem List   Diagnosis Date Noted  . Encounter for general adult medical examination with abnormal findings 07/04/2017  . Obese   . IBS (irritable bowel syndrome)   . GERD (gastroesophageal reflux disease)   . Anxiety     Past Surgical History:  Procedure Laterality Date  . APPENDECTOMY    . LAPAROSCOPIC APPENDECTOMY  10/04/2011   Procedure: APPENDECTOMY LAPAROSCOPIC;  Surgeon: Stark Klein, MD;  Location: [facility];   Service: General;  Laterality: N/A;  . WISDOM TOOTH EXTRACTION      Family History  Adopted: Yes    Social History   Socioeconomic History  . Marital status: Single    Spouse name: Not on file  . Number of children: Not on file  . Years of education: Not on file  . Highest education level: Not on file  Occupational History  . Not on file  Social Needs  . Financial resource strain: Not on file  . Food insecurity:    Worry: Not on file    Inability: Not on file  . Transportation needs:    Medical: Not on file    Non-medical: Not on file  Tobacco Use  . Smoking status: Never Smoker  . Smokeless tobacco: Never Used  Substance and Sexual Activity  . Alcohol use: No  . Drug use: No  . Sexual activity: Never  Lifestyle  . Physical activity:    Days per week: Not on file    Minutes per session: Not on file  . Stress: Not on file  Relationships  . Social connections:    Talks on phone: Not on file    Gets together: Not on file    Attends religious service: Not on file    Active member of club or organization: Not on file    Attends meetings of clubs or organizations: Not on file    Relationship status: Not on file  . Intimate partner violence:    Fear of current or ex partner: Not on file    Emotionally abused: Not on file    Physically abused: Not on file    Forced sexual activity: Not on file  Other Topics Concern  . Not on file  Social History Narrative   Single - lives alone   No pets, no kids   Significant other but not serious   From [place] originally, in [place] area since age 33yo     Current Outpatient Medications:  .  escitalopram (LEXAPRO) 10 MG tablet, Take 1 tablet (10 mg total) by mouth daily., Disp: 30 tablet, Rfl: 1  Allergies  Allergen Reactions  . Morphine And Related Nausea Only  . Vicodin [Hydrocodone-Acetaminophen] Other (See Comments)    Patient stated that gave her panic attack     ROS  Constitutional: Negative for fever  or weight change.  Respiratory: Negative for cough and shortness of breath.   Cardiovascular: Negative for chest pain or palpitations.  Gastrointestinal: Negative for abdominal pain, no bowel changes.  Musculoskeletal: Negative for gait problem or joint swelling.  Skin: Negative for rash.  Neurological: Negative for dizziness or headache.  No other specific complaints in a complete review of systems (except as listed in HPI above).   Objective  Vitals:   07/02/17 1503  BP: 122/82  Pulse: 66  Resp: 16  Temp: 98.4 F (36.9 C)  TempSrc: Oral  SpO2: 98%  Weight: 186 lb (84.4 kg)  Height: 5\' 5"  (1.651 m)    Body mass index is 30.95 kg/m.  Physical Exam Vital signs reviewed. Constitutional: Patient appears well-developed and well-nourished. No distress.  HENT: Head: Normocephalic and atraumatic. Ears: B TMs ok, no erythema or effusion; Nose: Nose normal. Mouth/Throat: Oropharynx is clear and moist. No oropharyngeal exudate.  Eyes: Conjunctivae and EOM are normal. Pupils are equal, round, and reactive to light. No scleral icterus.  Neck: Normal range of motion. Neck supple. No cervical adenopathy. No thyromegaly present.  Cardiovascular: Normal rate, irregular rhythm. No murmur heard. No BLE edema. Distal pulses intact. Pulmonary/Chest: Effort normal and breath sounds normal. No respiratory distress. Abdominal: Soft. Bowel sounds are normal, no distension. There is no tenderness. no masses Breast: defd to GYN FEMALE GENITALIA:  defd to GYN RECTAL: Musculoskeletal: Normal range of motion, no joint effusions. No gross deformities Neurological: She is alert and oriented to person, place, and time. No cranial nerve deficit. Coordination, balance, strength, speech and gait are normal.  Skin: Skin is warm and dry. No rash noted. No erythema.  Psychiatric: Patient has a normal mood and affect. behavior is normal. Judgment and thought content normal.  Fall Risk: Fall Risk  07/02/2017   Falls in the past year? No    Assessment & Plan RTC in 1 month for F/U: anxiety- starting lexapro  Irregular heartbeat Irregular heart beat auscultated on PE, she actually says she was told about this years ago with no instruction for further follow up - EKG 12-Lead- I have personally reviewed the EKG tracing and agree/disagree with computerized printout as noted: sinus rhythm, frequent PACs, comparison to prior EKG dated 05/06/13 unchanged as noted:  Sinus rhythm with Premature atrial complexes - Ambulatory referral to Cardiology- based on long history of PACs with no cardiac work up, will refer to cardiology for further evaluation and management

## 2017-07-02 NOTE — Patient Instructions (Addendum)
Please head downstairs for lab work/x-rays. If any of your test results are critically abnormal, you will be contacted right away. Otherwise, I will contact you within a week about your test results and any recommendations for abnormalities.  I have placed a referral to cardiology. Our office will call you to schedule this appointment. You should hear from our office in 7-10 days.   I have sent a prescription for lexapro '10mg'$  tablets to your pharmacy. Please start 1/2 tablet once daily for 1 week and then increase to a full tablet once daily on week two as tolerated.  Some side effects such as nausea, drowsiness and weight gain can occur.  Also rarely people have experienced suicidal thoughts when taking this medication.  Please discontinue the medication and go directly to ED if this occurs.    Id like to see you back in about 1 month to evaluate progress.    Health Maintenance, Female Adopting a healthy lifestyle and getting preventive care can go a long way to promote health and wellness. Talk with your health care provider about what schedule of regular examinations is right for you. This is a good chance for you to check in with your provider about disease prevention and staying healthy. In between checkups, there are plenty of things you can do on your own. Experts have done a lot of research about which lifestyle changes and preventive measures are most likely to keep you healthy. Ask your health care provider for more information. Weight and diet Eat a healthy diet  Be sure to include plenty of vegetables, fruits, low-fat dairy products, and lean protein.  Do not eat a lot of foods high in solid fats, added sugars, or salt.  Get regular exercise. This is one of the most important things you can do for your health. ? Most adults should exercise for at least 150 minutes each week. The exercise should increase your heart rate and make you sweat (moderate-intensity exercise). ? Most adults  should also do strengthening exercises at least twice a week. This is in addition to the moderate-intensity exercise.  Maintain a healthy weight  Body mass index (BMI) is a measurement that can be used to identify possible weight problems. It estimates body fat based on height and weight. Your health care provider can help determine your BMI and help you achieve or maintain a healthy weight.  For females 25 years of age and older: ? A BMI below 18.5 is considered underweight. ? A BMI of 18.5 to 24.9 is normal. ? A BMI of 25 to 29.9 is considered overweight. ? A BMI of 30 and above is considered obese.  Watch levels of cholesterol and blood lipids  You should start having your blood tested for lipids and cholesterol at 36 years of age, then have this test every 5 years.  You may need to have your cholesterol levels checked more often if: ? Your lipid or cholesterol levels are high. ? You are older than 36 years of age. ? You are at high risk for heart disease.  Cancer screening Lung Cancer  Lung cancer screening is recommended for adults 38-61 years old who are at high risk for lung cancer because of a history of smoking.  A yearly low-dose CT scan of the lungs is recommended for people who: ? Currently smoke. ? Have quit within the past 15 years. ? Have at least a 30-pack-year history of smoking. A pack year is smoking an average of one pack  of cigarettes a day for 1 year.  Yearly screening should continue until it has been 15 years since you quit.  Yearly screening should stop if you develop a health problem that would prevent you from having lung cancer treatment.  Breast Cancer  Practice breast self-awareness. This means understanding how your breasts normally appear and feel.  It also means doing regular breast self-exams. Let your health care provider know about any changes, no matter how small.  If you are in your 20s or 30s, you should have a clinical breast exam (CBE)  by a health care provider every 1-3 years as part of a regular health exam.  If you are 6 or older, have a CBE every year. Also consider having a breast X-ray (mammogram) every year.  If you have a family history of breast cancer, talk to your health care provider about genetic screening.  If you are at high risk for breast cancer, talk to your health care provider about having an MRI and a mammogram every year.  Breast cancer gene (BRCA) assessment is recommended for women who have family members with BRCA-related cancers. BRCA-related cancers include: ? Breast. ? Ovarian. ? Tubal. ? Peritoneal cancers.  Results of the assessment will determine the need for genetic counseling and BRCA1 and BRCA2 testing.  Cervical Cancer Your health care provider may recommend that you be screened regularly for cancer of the pelvic organs (ovaries, uterus, and vagina). This screening involves a pelvic examination, including checking for microscopic changes to the surface of your cervix (Pap test). You may be encouraged to have this screening done every 3 years, beginning at age 90.  For women ages 15-65, health care providers may recommend pelvic exams and Pap testing every 3 years, or they may recommend the Pap and pelvic exam, combined with testing for human papilloma virus (HPV), every 5 years. Some types of HPV increase your risk of cervical cancer. Testing for HPV may also be done on women of any age with unclear Pap test results.  Other health care providers may not recommend any screening for nonpregnant women who are considered low risk for pelvic cancer and who do not have symptoms. Ask your health care provider if a screening pelvic exam is right for you.  If you have had past treatment for cervical cancer or a condition that could lead to cancer, you need Pap tests and screening for cancer for at least 20 years after your treatment. If Pap tests have been discontinued, your risk factors (such as  having a new sexual partner) need to be reassessed to determine if screening should resume. Some women have medical problems that increase the chance of getting cervical cancer. In these cases, your health care provider may recommend more frequent screening and Pap tests.  Colorectal Cancer  This type of cancer can be detected and often prevented.  Routine colorectal cancer screening usually begins at 36 years of age and continues through 36 years of age.  Your health care provider may recommend screening at an earlier age if you have risk factors for colon cancer.  Your health care provider may also recommend using home test kits to check for hidden blood in the stool.  A small camera at the end of a tube can be used to examine your colon directly (sigmoidoscopy or colonoscopy). This is done to check for the earliest forms of colorectal cancer.  Routine screening usually begins at age 29.  Direct examination of the colon should be repeated  every 5-10 years through 36 years of age. However, you may need to be screened more often if early forms of precancerous polyps or small growths are found.  Skin Cancer  Check your skin from head to toe regularly.  Tell your health care provider about any new moles or changes in moles, especially if there is a change in a mole's shape or color.  Also tell your health care provider if you have a mole that is larger than the size of a pencil eraser.  Always use sunscreen. Apply sunscreen liberally and repeatedly throughout the day.  Protect yourself by wearing long sleeves, pants, a wide-brimmed hat, and sunglasses whenever you are outside.  Heart disease, diabetes, and high blood pressure  High blood pressure causes heart disease and increases the risk of stroke. High blood pressure is more likely to develop in: ? People who have blood pressure in the high end of the normal range (130-139/85-89 mm Hg). ? People who are overweight or  obese. ? People who are African American.  If you are 65-77 years of age, have your blood pressure checked every 3-5 years. If you are 69 years of age or older, have your blood pressure checked every year. You should have your blood pressure measured twice-once when you are at a hospital or clinic, and once when you are not at a hospital or clinic. Record the average of the two measurements. To check your blood pressure when you are not at a hospital or clinic, you can use: ? An automated blood pressure machine at a pharmacy. ? A home blood pressure monitor.  If you are between 72 years and 32 years old, ask your health care provider if you should take aspirin to prevent strokes.  Have regular diabetes screenings. This involves taking a blood sample to check your fasting blood sugar level. ? If you are at a normal weight and have a low risk for diabetes, have this test once every three years after 36 years of age. ? If you are overweight and have a high risk for diabetes, consider being tested at a younger age or more often. Preventing infection Hepatitis B  If you have a higher risk for hepatitis B, you should be screened for this virus. You are considered at high risk for hepatitis B if: ? You were born in a country where hepatitis B is common. Ask your health care provider which countries are considered high risk. ? Your parents were born in a high-risk country, and you have not been immunized against hepatitis B (hepatitis B vaccine). ? You have HIV or AIDS. ? You use needles to inject street drugs. ? You live with someone who has hepatitis B. ? You have had sex with someone who has hepatitis B. ? You get hemodialysis treatment. ? You take certain medicines for conditions, including cancer, organ transplantation, and autoimmune conditions.  Hepatitis C  Blood testing is recommended for: ? Everyone born from [birth year] through 1964. ? Anyone with known risk factors for hepatitis  C.  Sexually transmitted infections (STIs)  You should be screened for sexually transmitted infections (STIs) including gonorrhea and chlamydia if: ? You are sexually active and are younger than 36 years of age. ? You are older than 36 years of age and your health care provider tells you that you are at risk for this type of infection. ? Your sexual activity has changed since you were last screened and you are at an increased risk for chlamydia  or gonorrhea. Ask your health care provider if you are at risk.  If you do not have HIV, but are at risk, it may be recommended that you take a prescription medicine daily to prevent HIV infection. This is called pre-exposure prophylaxis (PrEP). You are considered at risk if: ? You are sexually active and do not regularly use condoms or know the HIV status of your partner(s). ? You take drugs by injection. ? You are sexually active with a partner who has HIV.  Talk with your health care provider about whether you are at high risk of being infected with HIV. If you choose to begin PrEP, you should first be tested for HIV. You should then be tested every 3 months for as long as you are taking PrEP. Pregnancy  If you are premenopausal and you may become pregnant, ask your health care provider about preconception counseling.  If you may become pregnant, take 400 to 800 micrograms (mcg) of folic acid every day.  If you want to prevent pregnancy, talk to your health care provider about birth control (contraception). Osteoporosis and menopause  Osteoporosis is a disease in which the bones lose minerals and strength with aging. This can result in serious bone fractures. Your risk for osteoporosis can be identified using a bone density scan.  If you are 67 years of age or older, or if you are at risk for osteoporosis and fractures, ask your health care provider if you should be screened.  Ask your health care provider whether you should take a calcium or  vitamin D supplement to lower your risk for osteoporosis.  Menopause may have certain physical symptoms and risks.  Hormone replacement therapy may reduce some of these symptoms and risks. Talk to your health care provider about whether hormone replacement therapy is right for you. Follow these instructions at home:  Schedule regular health, dental, and eye exams.  Stay current with your immunizations.  Do not use any tobacco products including cigarettes, chewing tobacco, or electronic cigarettes.  If you are pregnant, do not drink alcohol.  If you are breastfeeding, limit how much and how often you drink alcohol.  Limit alcohol intake to no more than 1 drink per day for nonpregnant women. One drink equals 12 ounces of beer, 5 ounces of wine, or 1 ounces of hard liquor.  Do not use street drugs.  Do not share needles.  Ask your health care provider for help if you need support or information about quitting drugs.  Tell your health care provider if you often feel depressed.  Tell your health care provider if you have ever been abused or do not feel safe at home. This information is not intended to replace advice given to you by your health care provider. Make sure you discuss any questions you have with your health care provider. Document Released: 07/14/2010 Document Revised: 06/06/2015 Document Reviewed: 10/02/2014 Elsevier Interactive Patient Education  [employer].

## 2017-07-03 LAB — HIV ANTIBODY (ROUTINE TESTING W REFLEX): HIV 1&2 Ab, 4th Generation: NONREACTIVE

## 2017-07-04 ENCOUNTER — Encounter: Payer: Self-pay | Admitting: Nurse Practitioner

## 2017-07-04 DIAGNOSIS — Z0001 Encounter for general adult medical examination with abnormal findings: Secondary | ICD-10-CM | POA: Insufficient documentation

## 2017-07-04 NOTE — Assessment & Plan Note (Signed)
Discussed options to treat anxiety and we will start SSRI trial today, lexapro Rx sent-dosing and side effects discussed She agreed to call 911 immediately for any thoughts of hurting herself or others Update labs RTC in 1 month for F/U - CBC; Future - Comprehensive metabolic panel; Future - TSH; Future - escitalopram (LEXAPRO) 10 MG tablet; Take 1 tablet (10 mg total) by mouth daily.  Dispense: 30 tablet; Refill: 1

## 2017-07-04 NOTE — Assessment & Plan Note (Signed)
-  USPSTF grade A and B recommendations reviewed with patient; age-appropriate recommendations, preventive care, screening tests, etc discussed and encouraged; healthy living and sunscreen use encouraged; see AVS for patient education given to patient. Declines advanced directives packet. -Discussed importance of 150 minutes of physical activity weekly, eat 6 servings of fruit/vegetables daily and drink plenty of water and avoid sweet beverages.  -Red flags and when to present for emergency care or RTC including fever >101.52F, chest pain, shortness of breath, new/worsening/un-resolving symptoms, reviewed with patient at time of visit. Follow up and care instructions discussed and provided in AVS.  -Reviewed Health Maintenance: Screening for HIV (human immunodeficiency virus)- HIV antibody; Future  Screening for cholesterol level fasting today - Lipid panel; Future  Elevated glucose- Hemoglobin A1c; Future  Class 1 obesity due to excess calories with body mass index (BMI) of 30.0 to 30.9 in adult, unspecified whether serious comorbidity present Encouraged to continue healthy diet and exercise for weight loss - CBC; Future - Comprehensive metabolic panel; Future - TSH; Future

## 2017-07-05 ENCOUNTER — Encounter: Payer: Self-pay | Admitting: Nurse Practitioner

## 2017-08-04 ENCOUNTER — Ambulatory Visit: Payer: 59 | Admitting: Nurse Practitioner

## 2017-08-25 NOTE — Progress Notes (Signed)
Cardiology Office Note:    Date:  08/26/2017   ID:  Kristin [name], DOB 1980/08/16, MRN [id number]  PCP:  Lance Sell, NP  Cardiologist:  Buford Dresser, MD PhD  Referring MD: Lance Sell, NP   CC: abnormal heart rhythm  History of Present Illness:    Kristin [name] is a 37 y.o. female without significant PMH who is seen as a new consult at the request of Caesar Chestnut for evaluation of irregular heartbeat. She was seen on 07/02/17 for a routine office visit and was noted to have an irregular heart beat on physical exam. ECG done at the time noted sinus rhythm with PACs. Patient reported a prior history of this as well.  She reports that she has been told in the past that she has an irregular rhythm. She denies chest pain, palpitations, shortness of breath, PND, orthopnea, or syncope. She checks her heart rate on her phone on occasion and notes a resting heart rate as low as in the 50s, routinely more in the 60s. Has not had any sustained fast heart rates. She used to be very active and first noted the irregularity at that time.   Denies any prior history of heart issues. She is not currently taking any medications. She was adopted and does not know her family history. She has never been a smoker, no alcohol or illicits.  Past Medical History:  Diagnosis Date  . Anxiety   . Genital warts   . GERD (gastroesophageal reflux disease)   . Hyperlipidemia   . Hypertension   . IBS (irritable bowel syndrome)    diarrhea prone    Past Surgical History:  Procedure Laterality Date  . APPENDECTOMY    . LAPAROSCOPIC APPENDECTOMY  10/04/2011   Procedure: APPENDECTOMY LAPAROSCOPIC;  Surgeon: Stark Klein, MD;  Location: [place];  Service: General;  Laterality: N/A;  . WISDOM TOOTH EXTRACTION      Current Medications: No current outpatient medications on file prior to visit.   No current facility-administered medications on file prior to visit.      Allergies:    Morphine and related and Vicodin [hydrocodone-acetaminophen]   Social History   Socioeconomic History  . Marital status: Single    Spouse name: Not on file  . Number of children: Not on file  . Years of education: Not on file  . Highest education level: Not on file  Occupational History  . Not on file  Social Needs  . Financial resource strain: Not on file  . Food insecurity:    Worry: Not on file    Inability: Not on file  . Transportation needs:    Medical: Not on file    Non-medical: Not on file  Tobacco Use  . Smoking status: Never Smoker  . Smokeless tobacco: Never Used  Substance and Sexual Activity  . Alcohol use: No  . Drug use: No  . Sexual activity: Never  Lifestyle  . Physical activity:    Days per week: Not on file    Minutes per session: Not on file  . Stress: Not on file  Relationships  . Social connections:    Talks on phone: Not on file    Gets together: Not on file    Attends religious service: Not on file    Active member of club or organization: Not on file    Attends meetings of clubs or organizations: Not on file    Relationship status: Not on file  Other  Topics Concern  . 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  ROS:   Please see the history of present illness.  Additional pertinent ROS: Review of Systems  Constitutional: Negative for chills, fever and weight loss.  HENT: Negative for ear pain and hearing loss.   Eyes: Negative for blurred vision and pain.  Respiratory: Negative for cough and shortness of breath.   Cardiovascular: Negative for chest pain, palpitations, orthopnea, claudication, leg swelling and PND.  Gastrointestinal: Negative for abdominal pain, blood in stool and melena.  Genitourinary: Negative for dysuria and hematuria.  Musculoskeletal: Negative for falls and joint pain.  Skin:  Negative for rash.  Neurological: Negative for focal weakness and loss of consciousness.  Endo/Heme/Allergies: Does not bruise/bleed easily.   EKGs/Labs/Other Studies Reviewed:    The following studies were reviewed today: Prior notes and ECG  EKG:  EKG is ordered today.  The ekg ordered today demonstrates sinus bradycardia with sinus arrhythmia. QTc not as long as noted, calculated as approximately 420 msec manually.   Recent Labs: 07/02/2017: ALT 9; BUN 12; Creatinine, Ser 0.83; Hemoglobin 14.6; Platelets 184.0; Potassium 4.2; Sodium 137; TSH 1.64  Recent Lipid Panel    Component Value Date/Time   CHOL 205 (H) 07/02/2017 1556   TRIG 181.0 (H) 07/02/2017 1556   HDL 42.70 07/02/2017 1556   CHOLHDL 5 07/02/2017 1556   VLDL 36.2 07/02/2017 1556   LDLCALC 127 (H) 07/02/2017 1556   LDLDIRECT 106.8 08/31/2012 0837    Physical Exam:    VS:  BP 136/80   Pulse (!) 57   Ht 5\' 4"  (1.626 m)   Wt 184 lb 3.2 oz (83.6 kg)   BMI 31.62 kg/m     Wt Readings from Last 3 Encounters:  08/26/17 184 lb 3.2 oz (83.6 kg)  07/02/17 186 lb (84.4 kg)  05/08/13 180 lb 6.4 oz (81.8 kg)     GEN: Well nourished, well developed in no acute distress HEENT: Normal NECK: No JVD; No carotid bruits LYMPHATICS: No lymphadenopathy CARDIAC: regular rhythm, normal S1 and S2, no murmurs, rubs, gallops. Radial and DP pulses 2+ bilaterally. RESPIRATORY:  Clear to auscultation without rales, wheezing or rhonchi  ABDOMEN: Soft, non-tender, non-distended MUSCULOSKELETAL:  No edema; No deformity  SKIN: Warm and dry NEUROLOGIC:  Alert and oriented x 3 PSYCHIATRIC:  Normal affect   ASSESSMENT:    1. Irregular heartbeat   2. Sinus arrhythmia seen on electrocardiogram    PLAN:    1. Asymptomatic sinus arrhythmia: discussed option of wearing a monitor to evaluate for SVT or other arrhyhtmia. Patient is not bothered by her rhythm and not overly concerned. Counseled on appropriate heart rate ranges at rest and  with exercise given her age, as well as red flag symptoms that require medical attention. She will call the office if she becomes symptomatic or notices any new concerns, and we will pursue a monitor +/- echo at that time.   Plan for follow up: as needed  Medication Adjustments/Labs and Tests Ordered: Current medicines are reviewed at length with the patient today.  Concerns regarding medicines are outlined above.  Orders Placed This Encounter  Procedures  . EKG 12-Lead   No orders of the defined types were placed in this encounter.   Patient Instructions  Medication Instructions: Your physician recommends that you  continue on your current medications as directed. Please refer to the Current Medication list given to you today.   Follow-Up: Your physician recommends that you schedule a follow-up appointment as needed with Dr. Harrell Gave.        Signed, Buford Dresser, MD PhD 08/26/2017 12:38 PM    [place]

## 2017-08-26 ENCOUNTER — Encounter: Payer: Self-pay | Admitting: Cardiology

## 2017-08-26 ENCOUNTER — Ambulatory Visit (INDEPENDENT_AMBULATORY_CARE_PROVIDER_SITE_OTHER): Payer: 59 | Admitting: Cardiology

## 2017-08-26 VITALS — BP 136/80 | HR 57 | Ht 64.0 in | Wt 184.2 lb

## 2017-08-26 DIAGNOSIS — I499 Cardiac arrhythmia, unspecified: Secondary | ICD-10-CM

## 2017-08-26 DIAGNOSIS — I498 Other specified cardiac arrhythmias: Secondary | ICD-10-CM | POA: Diagnosis not present

## 2017-08-26 NOTE — Patient Instructions (Signed)
Medication Instructions: Your physician recommends that you continue on your current medications as directed. Please refer to the Current Medication list given to you today.   Follow-Up: Your physician recommends that you schedule a follow-up appointment as needed with Dr. Harrell Gave.

## 2017-09-01 MED FILL — ESCITALOPRAM 10 MG TABLET: 10 | 30 days supply | Qty: 30 | Fill #0

## 2017-09-08 ENCOUNTER — Telehealth: Payer: Self-pay | Admitting: Nurse Practitioner

## 2017-09-08 NOTE — Telephone Encounter (Signed)
I have received FMLA via fax for the patient from [employer]. I do not see any note about FMLA. Patient was asked to come back for a 20month FU at her 06/2017 visit. But she canceled the appointment.   Would you like for patient to come in for a FU and discuss the reason for the paperwork? Please advise - Thank you.

## 2017-09-08 NOTE — Telephone Encounter (Signed)
Yes please have her come in to discuss Thanks!

## 2017-09-09 NOTE — Telephone Encounter (Signed)
LVM to inform her to call the office back and set up an appointment to come in and speak with her PCP about the FMLA.

## 2017-09-14 NOTE — Telephone Encounter (Signed)
Patient has set up an appointment for 9/13 with PCP.

## 2017-09-24 ENCOUNTER — Encounter: Payer: Self-pay | Admitting: Nurse Practitioner

## 2017-09-24 ENCOUNTER — Ambulatory Visit (INDEPENDENT_AMBULATORY_CARE_PROVIDER_SITE_OTHER): Payer: 59 | Admitting: Nurse Practitioner

## 2017-09-24 VITALS — BP 130/90 | HR 72 | Ht 64.0 in | Wt 186.0 lb

## 2017-09-24 DIAGNOSIS — F419 Anxiety disorder, unspecified: Secondary | ICD-10-CM

## 2017-09-24 NOTE — Telephone Encounter (Addendum)
Forms have been completed & signed, Faxed to [employer] @ [phone], Copy sent to scan [employer]charged for.   LVM to inform patient - Original is ready to be picked up.

## 2017-09-24 NOTE — Patient Instructions (Signed)
We will complete your FMLA paperwork for you and let you know when ready  Please return to see me in about 1 month for follow up.   Living With Anxiety After being diagnosed with an anxiety disorder, you may be relieved to know why you have felt or behaved a certain way. It is natural to also feel overwhelmed about the treatment ahead and what it will mean for your life. With care and support, you can manage this condition and recover from it. How to cope with anxiety Dealing with stress Stress is your body's reaction to life changes and events, both good and bad. Stress can last just a few hours or it can be ongoing. Stress can play a major role in anxiety, so it is important to learn both how to cope with stress and how to think about it differently. Talk with your health care provider or a counselor to learn more about stress reduction. He or she may suggest some stress reduction techniques, such as:  Music therapy. This can include creating or listening to music that you enjoy and that inspires you.  Mindfulness-based meditation. This involves being aware of your normal breaths, rather than trying to control your breathing. It can be done while sitting or walking.  Centering prayer. This is a kind of meditation that involves focusing on a word, phrase, or sacred image that is meaningful to you and that brings you peace.  Deep breathing. To do this, expand your stomach and inhale slowly through your nose. Hold your breath for 3-5 seconds. Then exhale slowly, allowing your stomach muscles to relax.  Self-talk. This is a skill where you identify thought patterns that lead to anxiety reactions and correct those thoughts.  Muscle relaxation. This involves tensing muscles then relaxing them.  Choose a stress reduction technique that fits your lifestyle and personality. Stress reduction techniques take time and practice. Set aside 5-15 minutes a day to do them. Therapists can offer training in  these techniques. The training may be covered by some insurance plans. Other things you can do to manage stress include:  Keeping a stress diary. This can help you learn what triggers your stress and ways to control your response.  Thinking about how you respond to certain situations. You may not be able to control everything, but you can control your reaction.  Making time for activities that help you relax, and not feeling guilty about spending your time in this way.  Therapy combined with coping and stress-reduction skills provides the best chance for successful treatment. Medicines Medicines can help ease symptoms. Medicines for anxiety include:  Anti-anxiety drugs.  Antidepressants.  Beta-blockers.  Medicines may be used as the main treatment for anxiety disorder, along with therapy, or if other treatments are not working. Medicines should be prescribed by a health care provider. Relationships Relationships can play a big part in helping you recover. Try to spend more time connecting with trusted friends and family members. Consider going to couples counseling, taking family education classes, or going to family therapy. Therapy can help you and others better understand the condition. How to recognize changes in your condition Everyone has a different response to treatment for anxiety. Recovery from anxiety happens when symptoms decrease and stop interfering with your daily activities at home or work. This may mean that you will start to:  Have better concentration and focus.  Sleep better.  Be less irritable.  Have more energy.  Have improved memory.  It is important  to recognize when your condition is getting worse. Contact your health care provider if your symptoms interfere with home or work and you do not feel like your condition is improving. Where to find help and support: You can get help and support from these sources:  Self-help groups.  Online and [employer].  A trusted spiritual leader.  Couples counseling.  Family education classes.  Family therapy.  Follow these instructions at home:  Eat a healthy diet that includes plenty of vegetables, fruits, whole grains, low-fat dairy products, and lean protein. Do not eat a lot of foods that are high in solid fats, added sugars, or salt.  Exercise. Most adults should do the following: ? Exercise for at least 150 minutes each week. The exercise should increase your heart rate and make you sweat (moderate-intensity exercise). ? Strengthening exercises at least twice a week.  Cut down on caffeine, tobacco, alcohol, and other potentially harmful substances.  Get the right amount and quality of sleep. Most adults need 7-9 hours of sleep each night.  Make choices that simplify your life.  Take over-the-counter and prescription medicines only as told by your health care provider.  Avoid caffeine, alcohol, and certain over-the-counter cold medicines. These may make you feel worse. Ask your pharmacist which medicines to avoid.  Keep all follow-up visits as told by your health care provider. This is important. Questions to ask your health care provider  Would I benefit from therapy?  How often should I follow up with a health care provider?  How long do I need to take medicine?  Are there any long-term side effects of my medicine?  Are there any alternatives to taking medicine? Contact a health care provider if:  You have a hard time staying focused or finishing daily tasks.  You spend many hours a day feeling worried about everyday life.  You become exhausted by worry.  You start to have headaches, feel tense, or have nausea.  You urinate more than normal.  You have diarrhea. Get help right away if:  You have a racing heart and shortness of breath.  You have thoughts of hurting yourself or others. If you ever feel like you may hurt yourself or others, or have  thoughts about taking your own life, get help right away. You can go to your nearest emergency department or call:  Your local emergency services (911 in the [place]  A suicide crisis helpline, such as the [facility] at 918-205-7387. This is open 24-hours a day.  Summary  Taking steps to deal with stress can help calm you.  Medicines cannot cure anxiety disorders, but they can help ease symptoms.  Family, friends, and partners can play a big part in helping you recover from an anxiety disorder. This information is not intended to replace advice given to you by your health care provider. Make sure you discuss any questions you have with your health care provider. Document Released: 12/24/2015 Document Revised: 12/24/2015 Document Reviewed: 12/24/2015 Elsevier Interactive Patient Education  [employer].

## 2017-09-24 NOTE — Assessment & Plan Note (Signed)
Continue lexapro, discussed increasing dosage to 10 daily but she would like to stay at 5 for now, will have her return in 1 month for follow up Discussed home management of stress, return precautions, printed additional information on AVS Will give FMLA 1-2d per week x 3 months

## 2017-09-24 NOTE — Progress Notes (Signed)
Name: Kristin [name]   MRN: [id number]    DOB: 08-30-1980   Date:09/24/2017       Progress Note  Subjective  Chief Complaint Follow up  HPI  Kristin [name] is here today requesting FMLA paperwork for anxiety. I saw her on 07/02/17 for CPE and we discussed anxiety, lexapro was prescribed but she tells me today she did not start the lexapro until about 2 weeks ago. She wanted to see if her anxiety improved prior to starting Rx but she didn't seem to be getting better so she finally decided to try the lexapro. She started at 5mg  per day, half dose, which she is still taking. She says she has actually noticed improvement in her anxiety since starting the lexapro, sleeping better, less easily irritated and more energy. She does continue to have some trouble relaxing, has been waking up frequently at night with trouble sleeping due to feeling anxious and has had to call out of work when she didn't sleep. She is asking for FMLA for a few months while starting the lexapro. She denies depression, SI, HI.   Patient Active Problem List   Diagnosis Date Noted  . Encounter for general adult medical examination with abnormal findings 07/04/2017  . Obese   . IBS (irritable bowel syndrome)   . GERD (gastroesophageal reflux disease)   . Anxiety     Past Surgical History:  Procedure Laterality Date  . APPENDECTOMY    . LAPAROSCOPIC APPENDECTOMY  10/04/2011   Procedure: APPENDECTOMY LAPAROSCOPIC;  Surgeon: Stark Klein, MD;  Location: [facility];  Service: General;  Laterality: N/A;  . WISDOM TOOTH EXTRACTION      Family History  Adopted: Yes  Problem Relation Age of Onset  . Diabetes Mother   . Kidney disease Mother     Social History   Socioeconomic History  . Marital status: Single    Spouse name: Not on file  . Number of children: Not on file  . Years of education: Not on file  . Highest education level: Not on file  Occupational History  . Not on file  Social Needs  . Financial resource strain: Not  on file  . Food insecurity:    Worry: Not on file    Inability: Not on file  . Transportation needs:    Medical: Not on file    Non-medical: Not on file  Tobacco Use  . Smoking status: Never Smoker  . Smokeless tobacco: Never Used  Substance and Sexual Activity  . Alcohol use: No  . Drug use: No  . Sexual activity: Never  Lifestyle  . Physical activity:    Days per week: Not on file    Minutes per session: Not on file  . Stress: Not on file  Relationships  . Social connections:    Talks on phone: Not on file    Gets together: Not on file    Attends religious service: Not on file    Active member of club or organization: Not on file    Attends meetings of clubs or organizations: Not on file    Relationship status: Not on file  . Intimate partner violence:    Fear of current or ex partner: Not on file    Emotionally abused: Not on file    Physically abused: Not on file    Forced sexual activity: Not on file  Other Topics Concern  . 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  escitalopram (LEXAPRO) 10 MG tablet, Take 10 mg by mouth daily., Disp: , Rfl: 1  Allergies  Allergen Reactions  . Morphine And Related Nausea Only  . Vicodin [Hydrocodone-Acetaminophen] Other (See Comments)    Patient stated that gave her panic attack     ROS See HPI  Objective  Vitals:   09/24/17 0801  BP: 130/90  Pulse: 72  SpO2: 98%  Weight: 186 lb (84.4 kg)  Height: 5\' 4"  (1.626 m)    Body mass index is 31.93 kg/m.  Physical Exam Vital signs reviewed. Constitutional: Patient appears well-developed and well-nourished. No distress.  HENT: Head: Normocephalic and atraumatic. Nose: Nose normal. Mouth/Throat: Oropharynx is clear and moist. No oropharyngeal exudate.  Eyes: Conjunctivae and EOM are normal. Pupils are equal, round, and reactive to  light. No scleral icterus.  Neck: Normal range of motion. Neck supple.  Cardiovascular: Normal rate, regular rhythm and normal heart sounds.  No murmur heard. No BLE edema. Distal pulses intact. Pulmonary/Chest: Effort normal and breath sounds normal. No respiratory distress. Neurological: She is alert and oriented to person, place, and time. No cranial nerve deficit. Coordination, balance, strength, speech and gait are normal.  Skin: Skin is warm and dry. No rash noted. No erythema.  Psychiatric: Patient has a normal mood and affect. behavior is normal. Judgment and thought content normal.   Assessment & Plan RTC in 1 month for F/U: Anxiety- evaluate response to lexapro

## 2017-10-27 ENCOUNTER — Encounter: Payer: Self-pay | Admitting: Nurse Practitioner

## 2017-10-27 ENCOUNTER — Ambulatory Visit (INDEPENDENT_AMBULATORY_CARE_PROVIDER_SITE_OTHER): Payer: 59 | Admitting: Nurse Practitioner

## 2017-10-27 VITALS — BP 134/86 | HR 83 | Ht 64.0 in | Wt 191.0 lb

## 2017-10-27 DIAGNOSIS — Z23 Encounter for immunization: Secondary | ICD-10-CM | POA: Diagnosis not present

## 2017-10-27 DIAGNOSIS — F419 Anxiety disorder, unspecified: Secondary | ICD-10-CM | POA: Diagnosis not present

## 2017-10-27 MED ORDER — ESCITALOPRAM OXALATE 5 MG PO TABS: 10.0000 mg | ORAL_TABLET | Freq: Every day | ORAL | 2 refills | Status: DC

## 2017-10-27 MED FILL — ESCITALOPRAM 5 MG TABLET: 5 | 45 days supply | Qty: 90 | Fill #0

## 2017-10-27 NOTE — Assessment & Plan Note (Signed)
Stable on low dose of lexapro, will continue Plan to follow up in about 8 months at her CPE, but she was instructed to return sooner for new, worsening symptoms

## 2017-10-27 NOTE — Progress Notes (Signed)
Kristin [name] is a 37 y.o. female with the following history as recorded in EpicCare:  Patient Active Problem List   Diagnosis Date Noted  . Encounter for general adult medical examination with abnormal findings 07/04/2017  . Obese   . IBS (irritable bowel syndrome)   . GERD (gastroesophageal reflux disease)   . Anxiety     Current Outpatient Medications  Medication Sig Dispense Refill  . escitalopram (LEXAPRO) 5 MG tablet Take 2 tablets (10 mg total) by mouth daily. 90 tablet 2   No current facility-administered medications for this visit.     Allergies: Morphine and related and Vicodin [hydrocodone-acetaminophen]  Past Medical History:  Diagnosis Date  . Anxiety   . Genital warts   . GERD (gastroesophageal reflux disease)   . Hyperlipidemia   . Hypertension   . IBS (irritable bowel syndrome)    diarrhea prone    Past Surgical History:  Procedure Laterality Date  . APPENDECTOMY    . LAPAROSCOPIC APPENDECTOMY  10/04/2011   Procedure: APPENDECTOMY LAPAROSCOPIC;  Surgeon: Stark Klein, MD;  Location: [facility];  Service: General;  Laterality: N/A;  . WISDOM TOOTH EXTRACTION      Family History  Adopted: Yes  Problem Relation Age of Onset  . Diabetes Mother   . Kidney disease Mother     Social History   Tobacco Use  . Smoking status: Never Smoker  . Smokeless tobacco: Never Used  Substance Use Topics  . Alcohol use: No     Subjective:  Ms Kristin [name] is Here today for anxiety f/u, last saw her on 09/24/17, she had just started lexapro due to increasing anxiety ( originally prescribed her lexapro at 07/02/17 CPE but she didn't start the Rx at that time). She told me on 09/24/17 she was feeling a little better after 2 weeks of lexapro 5mg  daily, but did request FMLA while getting started on the lexapro because she was having trouble at work due to her anxiety and not sleeping well She is back today for F/U, says she is feeling good, anxiety has greatly improved and she is sleeping  great, 8-9 hours a night. Still taking lexapro 5 mg daily, initially had some nausea when starting the medication but now feels she is tolerating well with no adverse effects. No fevers, chills, abdominal pain, nausea, vomiting, depression, SI, HI  ROS - See HPI  Objective:  Vitals:   10/27/17 0859  BP: 134/86  Pulse: 83  SpO2: 98%  Weight: 191 lb (86.6 kg)  Height: 5\' 4"  (1.626 m)    General: Well developed, well nourished, in no acute distress  Skin : Warm and dry.  Head: Normocephalic and atraumatic  Eyes: Sclera and conjunctiva clear; pupils round and reactive to light; extraocular movements intact  Oropharynx: Pink, supple. No suspicious lesions  Neck: Supple  Lungs: Respirations unlabored; clear to auscultation bilaterally without wheeze, rales, rhonchi  CVS exam: normal rate, regular rhythm, normal S1, S2, no murmurs, rubs, clicks or gallops.  Extremities: No edema, cyanosis Vessels: Symmetric bilaterally  Neurologic: Alert and oriented; speech intact; face symmetrical; moves all extremities well; CNII-XII intact without focal deficit   Assessment:  1. Need for influenza vaccination   2. Anxiety     Plan:   Return for CPE after 07/02/17, anxiety F/U.  Orders Placed This Encounter  Procedures  . Flu Vaccine QUAD 36+ mos IM    Requested Prescriptions   Signed Prescriptions Disp Refills  . escitalopram (LEXAPRO) 5 MG tablet 90  tablet 2    Sig: Take 2 tablets (10 mg total) by mouth daily.   Need for influenza vaccination- Flu Vaccine QUAD 36+ mos IM

## 2017-10-27 NOTE — Patient Instructions (Signed)
It was good to see you today, I am glad you are feeling better!

## 2018-02-07 MED FILL — ESCITALOPRAM 5 MG TABLET: 5 | 45 days supply | Qty: 90 | Fill #1

## 2018-04-01 ENCOUNTER — Ambulatory Visit: Payer: 59 | Admitting: Family Medicine

## 2018-04-01 ENCOUNTER — Encounter: Payer: Self-pay | Admitting: Family Medicine

## 2018-04-01 ENCOUNTER — Other Ambulatory Visit: Payer: Self-pay

## 2018-04-01 VITALS — BP 110/80 | HR 86 | Temp 98.4°F | Ht 64.0 in | Wt 202.0 lb

## 2018-04-01 DIAGNOSIS — F419 Anxiety disorder, unspecified: Secondary | ICD-10-CM

## 2018-04-01 DIAGNOSIS — E669 Obesity, unspecified: Secondary | ICD-10-CM | POA: Diagnosis not present

## 2018-04-01 DIAGNOSIS — K580 Irritable bowel syndrome with diarrhea: Secondary | ICD-10-CM

## 2018-04-01 NOTE — Progress Notes (Signed)
Subjective:    Patient ID: Kristin [name], female    DOB: [birth year], 38 y.o.   MRN: [id number]  No chief complaint on file.   HPI Patient was seen today for follow-up on chronic conditions and TOC.  Patient previously seen by Brien Few, NP.  IBS: -Mostly diarrhea -Patient notes improvement in symptoms since taking Lexapro and having her appendix removed -Has been working on improving her diet.  Obesity: -Patient endorses wanting to lose weight -Trying to drink more water and eat better -May walk or ride her bike for exercise  Anxiety: -Taking Lexapro 5 mg daily -Endorses improvement in symptoms since being on medication -Endorses good mood, sleep, energy  Arrythmia: -Does note history of irregular heartbeat since starting Lexapro. -notes being told had PACs and questionably prolonged QTc -Saw cardiology -Would like EKG in June during physical just to make sure -Drinks 1 Dr. Malachi Bonds per day then mostly water -Denies chest discomfort, floaters, headache, blurred vision  Past Medical History:  Diagnosis Date  . Anxiety   . Genital warts   . GERD (gastroesophageal reflux disease)   . Hyperlipidemia   . Hypertension   . IBS (irritable bowel syndrome)    diarrhea prone    Allergies  Allergen Reactions  . Morphine And Related Nausea Only  . Vicodin [Hydrocodone-Acetaminophen] Other (See Comments)    Patient stated that gave her panic attack    ROS General: Denies fever, chills, night sweats, changes in weight, changes in appetite HEENT: Denies headaches, ear pain, changes in vision, rhinorrhea, sore throat CV: Denies CP, palpitations, SOB, orthopnea Pulm: Denies SOB, cough, wheezing GI: Denies abdominal pain, nausea, vomiting, diarrhea, constipation GU: Denies dysuria, hematuria, frequency, vaginal discharge Msk: Denies muscle cramps, joint pains Neuro: Denies weakness, numbness, tingling Skin: Denies rashes, bruising Psych: Denies depression, hallucinations   +anxiety    Objective:    Blood pressure 110/80, pulse 86, temperature 98.4 F (36.9 C), temperature source Oral, height 5\' 4"  (1.626 m), weight 202 lb (91.6 kg), last menstrual period 03/24/2018, SpO2 97 %.  Gen. Pleasant, well-nourished, in no distress, normal affect   HEENT: [place]/AT, face symmetric, no scleral icterus, PERRLA, nares patent without drainage, TMs normal b/l Lungs: no accessory muscle use, CTAB, no wheezes or rales Cardiovascular: RRR, no m/r/g, no peripheral edema Neuro:  A&Ox3, CN II-XII intact, normal gait Skin:  Warm, no lesions/ rash  Wt Readings from Last 3 Encounters:  04/01/18 202 lb (91.6 kg)    Lab Results  Component Value Date   WBC 9.4 07/02/2017   HGB 14.6 07/02/2017   HCT 44.6 07/02/2017   PLT 184.0 07/02/2017   GLUCOSE 94 07/02/2017   CHOL 205 (H) 07/02/2017   TRIG 181.0 (H) 07/02/2017   HDL 42.70 07/02/2017   LDLDIRECT 106.8 08/31/2012   LDLCALC 127 (H) 07/02/2017   ALT 9 07/02/2017   AST 14 07/02/2017   NA 137 07/02/2017   K 4.2 07/02/2017   CL 103 07/02/2017   CREATININE 0.83 07/02/2017   BUN 12 07/02/2017   CO2 27 07/02/2017   TSH 1.64 07/02/2017   HGBA1C 5.6 07/02/2017    Assessment/Plan:  Anxiety -Controlled -Continue Lexapro 5 mg daily -We will readdress at each visit  Irritable bowel syndrome with diarrhea -Stable -Discussed reducing stress, eating plenty of fiber and other ways to reduce symptoms  Obesity (BMI 30.0-34.9) -Discussed ways to lose weight including increasing physical activity and decreasing caloric intake -Continue lifestyle modifications  Patient declined handout  F/u in 4-6 months  for CPE, sooner if needed.  Will obtain EKG at next [facility] for CPE.  Grier Mitts, MD

## 2018-04-14 MED FILL — ESCITALOPRAM 5 MG TABLET: 5 | 45 days supply | Qty: 90 | Fill #0

## 2018-06-15 DIAGNOSIS — H5213 Myopia, bilateral: Secondary | ICD-10-CM | POA: Diagnosis not present

## 2018-06-30 DIAGNOSIS — Z01419 Encounter for gynecological examination (general) (routine) without abnormal findings: Secondary | ICD-10-CM | POA: Diagnosis not present

## 2018-06-30 DIAGNOSIS — N760 Acute vaginitis: Secondary | ICD-10-CM | POA: Diagnosis not present

## 2018-06-30 DIAGNOSIS — Z0000 Encounter for general adult medical examination without abnormal findings: Secondary | ICD-10-CM | POA: Diagnosis not present

## 2018-06-30 DIAGNOSIS — Z124 Encounter for screening for malignant neoplasm of cervix: Secondary | ICD-10-CM | POA: Diagnosis not present

## 2018-07-04 ENCOUNTER — Encounter: Payer: 59 | Admitting: Nurse Practitioner

## 2018-07-05 DIAGNOSIS — Z0000 Encounter for general adult medical examination without abnormal findings: Secondary | ICD-10-CM | POA: Diagnosis not present

## 2018-07-06 ENCOUNTER — Ambulatory Visit (INDEPENDENT_AMBULATORY_CARE_PROVIDER_SITE_OTHER): Payer: 59 | Admitting: Family Medicine

## 2018-07-06 ENCOUNTER — Other Ambulatory Visit: Payer: Self-pay

## 2018-07-06 ENCOUNTER — Encounter: Payer: Self-pay | Admitting: Family Medicine

## 2018-07-06 VITALS — BP 118/88 | HR 74 | Temp 98.3°F | Wt 203.0 lb

## 2018-07-06 DIAGNOSIS — Z0000 Encounter for general adult medical examination without abnormal findings: Secondary | ICD-10-CM

## 2018-07-06 DIAGNOSIS — F419 Anxiety disorder, unspecified: Secondary | ICD-10-CM | POA: Diagnosis not present

## 2018-07-06 DIAGNOSIS — R002 Palpitations: Secondary | ICD-10-CM | POA: Diagnosis not present

## 2018-07-06 MED ORDER — ESCITALOPRAM OXALATE 5 MG PO TABS: 5.0000 mg | ORAL_TABLET | Freq: Every day | ORAL | 3 refills | Status: DC

## 2018-07-06 MED FILL — ESCITALOPRAM 5 MG TABLET: 5 | 90 days supply | Qty: 90 | Fill #0

## 2018-07-06 NOTE — Progress Notes (Addendum)
Subjective:     Kristin [name] is a 38 y.o. female and is here for a comprehensive physical exam. The patient reports no problems. Had annual exam and fasting blood work with OB/Gyn last wk.  Unsure of which labs were done.  Mood, energy, and sleep are good.  Taking Lexapro 5 mg for anxiety, needs refill.  Overall doing well.  Not really exercising, went hiking last wk.  Not noticing any PVCs.  Saw Cardiology in the past for this.  Drinking more soda.  Social History   Socioeconomic History  . Marital status: Single    Spouse name: Not on file  . Number of children: Not on file  . Years of education: Not on file  . Highest education level: Not on file  Occupational History  . Not on file  Social Needs  . Financial resource strain: Not on file  . Food insecurity    Worry: Not on file    Inability: Not on file  . Transportation needs    Medical: Not on file    Non-medical: Not on file  Tobacco Use  . Smoking status: Never Smoker  . Smokeless tobacco: Never Used  Substance and Sexual Activity  . Alcohol use: No  . Drug use: No  . Sexual activity: Never  Lifestyle  . Physical activity    Days per week: Not on file    Minutes per session: Not on file  . Stress: Not on file  Relationships  . Social Herbalist on phone: Not on file    Gets together: Not on file    Attends religious service: Not on file    Active member of club or organization: Not on file    Attends meetings of clubs or organizations: Not on file    Relationship status: Not on file  . Intimate partner violence    Fear of current or ex partner: Not on file    Emotionally abused: Not on file    Physically abused: Not on file    Forced sexual activity: Not on file  Other Topics Concern  . 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 Samul Dada  07/13/2018  .  INFLUENZA VACCINE  08/13/2018  . PAP SMEAR-Modifier  01/02/2019  . HIV Screening  Completed    The following portions of the patient's history were reviewed and updated as appropriate: allergies, current medications, past family history, past medical history, past social history, past surgical history and problem list.  Review of Systems Pertinent items noted in HPI and remainder of comprehensive ROS otherwise negative.   Objective:    BP 118/88 (BP Location: Left Arm, Patient Position: Sitting, Cuff Size: Normal)   Pulse 74   Temp 98.3 F (36.8 C) (Oral)   Wt 203 lb (92.1 kg)   SpO2 98%   BMI 34.84 kg/m  General appearance: alert, cooperative and no distress Head: Normocephalic, without obvious abnormality, atraumatic Eyes: conjunctivae/corneas clear. PERRL, EOM's intact. Fundi benign. Ears: normal TM's and external ear canals both ears Nose: Nares normal. Septum midline. Mucosa normal. No drainage or sinus tenderness. Throat: lips, mucosa, and tongue normal; teeth and gums normal Neck: no adenopathy, no carotid bruit, no JVD, supple, symmetrical, trachea midline and thyroid not enlarged, symmetric, no tenderness/mass/nodules Lungs: clear to auscultation bilaterally Heart: regular  rate and rhythm, S1, S2 normal, no murmur, click, rub or gallop Abdomen: soft, non-tender; bowel sounds normal; no masses,  no organomegaly Extremities: extremities normal, atraumatic, no cyanosis or edema Pulses: 2+ and symmetric Skin: Skin color, texture, turgor normal. No rashes or lesions Lymph nodes: Cervical, supraclavicular, and axillary nodes normal. Neurologic: Alert and oriented X 3, normal strength and tone. Normal symmetric reflexes. Normal coordination and gait    Assessment:    Healthy female exam with h/o anxiety.     Plan:      Anticipatory guidance given including wearing seatbelts, smoke detectors in the home, increasing physical activity, increasing p.o. intake of water and  vegetables. -will obtain copy of labs from OB/Gyn office -pap up to date, done 06/2018 -given handouts See After Visit Summary for Counseling Recommendations    Anxiety -stable -PHQ 9 score 1 -GAD 7 score 0 -continue lexapro 5 mg daily -med refilled -f/u in 3-6 months for anxiety  Palpitations -Stable -Discussed reducing caffeine intake, drinking plenty of water, reducing stress, and other ways to reduce frequency -We will continue to monitor -If increase in frequency will obtain Holter monitor  Next CPE in 1 yr.     Grier Mitts, MD

## 2018-07-06 NOTE — Patient Instructions (Addendum)
Preventive Care 18-39 Years, Female Preventive care refers to lifestyle choices and visits with your health care provider that can promote health and wellness. What does preventive care include?   A yearly physical exam. This is also called an annual well check.  Dental exams once or twice a year.  Routine eye exams. Ask your health care provider how often you should have your eyes checked.  Personal lifestyle choices, including: ? Daily care of your teeth and gums. ? Regular physical activity. ? Eating a healthy diet. ? Avoiding tobacco and drug use. ? Limiting alcohol use. ? Practicing safe sex. ? Taking vitamin and mineral supplements as recommended by your health care provider. What happens during an annual well check? The services and screenings done by your health care provider during your annual well check will depend on your age, overall health, lifestyle risk factors, and family history of disease. Counseling Your health care provider may ask you questions about your:  Alcohol use.  Tobacco use.  Drug use.  Emotional well-being.  Home and relationship well-being.  Sexual activity.  Eating habits.  Work and work environment.  Method of birth control.  Menstrual cycle.  Pregnancy history. Screening You may have the following tests or measurements:  Height, weight, and BMI.  Diabetes screening. This is done by checking your blood sugar (glucose) after you have not eaten for a while (fasting).  Blood pressure.  Lipid and cholesterol levels. These may be checked every 5 years starting at age 20.  Skin check.  Hepatitis C blood test.  Hepatitis B blood test.  Sexually transmitted disease (STD) testing.  BRCA-related cancer screening. This may be done if you have a family history of breast, ovarian, tubal, or peritoneal cancers.  Pelvic exam and Pap test. This may be done every 3 years starting at age 21. Starting at age 30, this may be done every 5  years if you have a Pap test in combination with an HPV test. Discuss your test results, treatment options, and if necessary, the need for more tests with your health care provider. Vaccines Your health care provider may recommend certain vaccines, such as:  Influenza vaccine. This is recommended every year.  Tetanus, diphtheria, and acellular pertussis (Tdap, Td) vaccine. You may need a Td booster every 10 years.  Varicella vaccine. You may need this if you have not been vaccinated.  HPV vaccine. If you are 26 or younger, you may need three doses over 6 months.  Measles, mumps, and rubella (MMR) vaccine. You may need at least one dose of MMR. You may also need a second dose.  Pneumococcal 13-valent conjugate (PCV13) vaccine. You may need this if you have certain conditions and were not previously vaccinated.  Pneumococcal polysaccharide (PPSV23) vaccine. You may need one or two doses if you smoke cigarettes or if you have certain conditions.  Meningococcal vaccine. One dose is recommended if you are age 19-21 years and a first-year college student living in a residence hall, or if you have one of several medical conditions. You may also need additional booster doses.  Hepatitis A vaccine. You may need this if you have certain conditions or if you travel or work in places where you may be exposed to hepatitis A.  Hepatitis B vaccine. You may need this if you have certain conditions or if you travel or work in places where you may be exposed to hepatitis B.  Haemophilus influenzae type b (Hib) vaccine. You may need this if you   have certain risk factors. Talk to your health care provider about which screenings and vaccines you need and how often you need them. This information is not intended to replace advice given to you by your health care provider. Make sure you discuss any questions you have with your health care provider. Document Released: 02/24/2001 Document Revised: 08/11/2016  Document Reviewed: 10/30/2014 Elsevier Interactive Patient Education  2019 [employer]  After being diagnosed with an anxiety disorder, you may be relieved to know why you have felt or behaved a certain way. It is natural to also feel overwhelmed about the treatment ahead and what it will mean for your life. With care and support, you can manage this condition and recover from it. How to cope with anxiety Dealing with stress Stress is your body's reaction to life changes and events, both good and bad. Stress can last just a few hours or it can be ongoing. Stress can play a major role in anxiety, so it is important to learn both how to cope with stress and how to think about it differently. Talk with your health care provider or a counselor to learn more about stress reduction. He or she may suggest some stress reduction techniques, such as:  Music therapy. This can include creating or listening to music that you enjoy and that inspires you.  Mindfulness-based meditation. This involves being aware of your normal breaths, rather than trying to control your breathing. It can be done while sitting or walking.  Centering prayer. This is a kind of meditation that involves focusing on a word, phrase, or sacred image that is meaningful to you and that brings you peace.  Deep breathing. To do this, expand your stomach and inhale slowly through your nose. Hold your breath for 3-5 seconds. Then exhale slowly, allowing your stomach muscles to relax.  Self-talk. This is a skill where you identify thought patterns that lead to anxiety reactions and correct those thoughts.  Muscle relaxation. This involves tensing muscles then relaxing them. Choose a stress reduction technique that fits your lifestyle and personality. Stress reduction techniques take time and practice. Set aside 5-15 minutes a day to do them. Therapists can offer training in these techniques. The training may be  covered by some insurance plans. Other things you can do to manage stress include:  Keeping a stress diary. This can help you learn what triggers your stress and ways to control your response.  Thinking about how you respond to certain situations. You may not be able to control everything, but you can control your reaction.  Making time for activities that help you relax, and not feeling guilty about spending your time in this way. Therapy combined with coping and stress-reduction skills provides the best chance for successful treatment. Medicines Medicines can help ease symptoms. Medicines for anxiety include:  Anti-anxiety drugs.  Antidepressants.  Beta-blockers. Medicines may be used as the main treatment for anxiety disorder, along with therapy, or if other treatments are not working. Medicines should be prescribed by a health care provider. Relationships Relationships can play a big part in helping you recover. Try to spend more time connecting with trusted friends and family members. Consider going to couples counseling, taking family education classes, or going to family therapy. Therapy can help you and others better understand the condition. How to recognize changes in your condition Everyone has a different response to treatment for anxiety. Recovery from anxiety happens when symptoms decrease and stop interfering with  your daily activities at home or work. This may mean that you will start to:  Have better concentration and focus.  Sleep better.  Be less irritable.  Have more energy.  Have improved memory. It is important to recognize when your condition is getting worse. Contact your health care provider if your symptoms interfere with home or work and you do not feel like your condition is improving. Where to find help and support: You can get help and support from these sources:  Self-help groups.  Online and [employer].  A trusted spiritual leader.   Couples counseling.  Family education classes.  Family therapy. Follow these instructions at home:  Eat a healthy diet that includes plenty of vegetables, fruits, whole grains, low-fat dairy products, and lean protein. Do not eat a lot of foods that are high in solid fats, added sugars, or salt.  Exercise. Most adults should do the following: ? Exercise for at least 150 minutes each week. The exercise should increase your heart rate and make you sweat (moderate-intensity exercise). ? Strengthening exercises at least twice a week.  Cut down on caffeine, tobacco, alcohol, and other potentially harmful substances.  Get the right amount and quality of sleep. Most adults need 7-9 hours of sleep each night.  Make choices that simplify your life.  Take over-the-counter and prescription medicines only as told by your health care provider.  Avoid caffeine, alcohol, and certain over-the-counter cold medicines. These may make you feel worse. Ask your pharmacist which medicines to avoid.  Keep all follow-up visits as told by your health care provider. This is important. Questions to ask your health care provider  Would I benefit from therapy?  How often should I follow up with a health care provider?  How long do I need to take medicine?  Are there any long-term side effects of my medicine?  Are there any alternatives to taking medicine? Contact a health care provider if:  You have a hard time staying focused or finishing daily tasks.  You spend many hours a day feeling worried about everyday life.  You become exhausted by worry.  You start to have headaches, feel tense, or have nausea.  You urinate more than normal.  You have diarrhea. Get help right away if:  You have a racing heart and shortness of breath.  You have thoughts of hurting yourself or others. If you ever feel like you may hurt yourself or others, or have thoughts about taking your own life, get help right  away. You can go to your nearest emergency department or call:  Your local emergency services (911 in the [place]  A suicide crisis helpline, such as the [place] at (450) 880-0782. This is open 24-hours a day. Summary  Taking steps to deal with stress can help calm you.  Medicines cannot cure anxiety disorders, but they can help ease symptoms.  Family, friends, and partners can play a big part in helping you recover from an anxiety disorder. This information is not intended to replace advice given to you by your health care provider. Make sure you discuss any questions you have with your health care provider. Document Released: 12/24/2015 Document Revised: 12/24/2015 Document Reviewed: 12/24/2015 Elsevier Interactive Patient Education  2019 [employer].

## 2018-07-20 MED FILL — ESCITALOPRAM 5 MG TABLET: 5 | 90 days supply | Qty: 90 | Fill #0

## 2018-11-04 MED FILL — ESCITALOPRAM 5 MG TABLET: 5 | 30 days supply | Qty: 30 | Fill #1

## 2018-12-05 MED FILL — ESCITALOPRAM 5 MG TABLET: 5 | 30 days supply | Qty: 30 | Fill #2

## 2018-12-07 ENCOUNTER — Other Ambulatory Visit: Payer: Self-pay | Admitting: Obstetrics and Gynecology

## 2018-12-07 NOTE — H&P (Signed)
38 y.o. G0 with irregular bleeding that started as just post sex but then lasted for 6 weeks.  On [place] there is a hypervasuclar mass in endometrial cavity that could be a polyp or fibroid.    Past Medical History:  Diagnosis Date  . Anxiety   . Genital warts   . GERD (gastroesophageal reflux disease)   . Hyperlipidemia   . Hypertension   . IBS (irritable bowel syndrome)    diarrhea prone   Past Surgical History:  Procedure Laterality Date  . APPENDECTOMY    . LAPAROSCOPIC APPENDECTOMY  10/04/2011   Procedure: APPENDECTOMY LAPAROSCOPIC;  Surgeon: Stark Klein, MD;  Location: [facility];  Service: General;  Laterality: N/A;  . WISDOM TOOTH EXTRACTION      Social History   Socioeconomic History  . Marital status: Single    Spouse name: Not on file  . Number of children: Not on file  . Years of education: Not on file  . Highest education level: Not on file  Occupational History  . Not on file  Social Needs  . Financial resource strain: Not on file  . Food insecurity    Worry: Not on file    Inability: Not on file  . Transportation needs    Medical: Not on file    Non-medical: Not on file  Tobacco Use  . Smoking status: Never Smoker  . Smokeless tobacco: Never Used  Substance and Sexual Activity  . Alcohol use: No  . Drug use: No  . Sexual activity: Never  Lifestyle  . Physical activity    Days per week: Not on file    Minutes per session: Not on file  . Stress: Not on file  Relationships  . Social Herbalist on phone: Not on file    Gets together: Not on file    Attends religious service: Not on file    Active member of club or organization: Not on file    Attends meetings of clubs or organizations: Not on file    Relationship status: Not on file  . Intimate partner violence    Fear of current or ex partner: Not on file    Emotionally abused: Not on file    Physically abused: Not on file    Forced sexual activity: Not on file  Other Topics Concern  . 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    Current Outpatient Medications on File Prior to Encounter  Medication Sig Dispense Refill  . escitalopram (LEXAPRO) 5 MG tablet Take 1 tablet (5 mg total) by mouth daily. 90 tablet 3    Allergies  Allergen Reactions  . Morphine And Related Nausea Only  . Vicodin [Hydrocodone-Acetaminophen] Other (See Comments)    Patient stated that gave her panic attack    There were no vitals filed for this visit.  Lungs: clear to ascultation Cor:  RRR Abdomen:  soft, nontender, nondistended. Ex:  no cords, erythema Pelvic:   Vulva: no masses, no atrophy, no lesions Vagina: no tenderness, no erythema, no abnormal vaginal discharge, no vesicle(s) or ulcers, no cystocele, no rectocele Cervix: grossly normal, no discharge, no cervical motion tenderness, sample taken for a Pap smear (very slight bleeding after pap - but during BME, not after brush.) Uterus: normal size (7), normal  shape, midline, no uterine prolapse, non-tender Bladder/Urethra: normal meatus, no urethral discharge, no urethral mass, bladder non distended, Urethra well supported Adnexa/Parametria: no parametrial tenderness, no parametrial mass, no adnexal tenderness, no ovarian mass   A:  For d&c, hysteroscopy, myosure polypectomy or myomectomy.     P: P: All risks, benefits and alternatives d/w patient and she desires to proceed.  Patient has undergone a modified diet, ERAS protocol and will receive  SCDs during the operation.   Pt will go home same day if eating, ambulating, voiding and pain control is good.  Daria Pastures

## 2018-12-15 ENCOUNTER — Encounter (HOSPITAL_BASED_OUTPATIENT_CLINIC_OR_DEPARTMENT_OTHER): Payer: Self-pay | Admitting: *Deleted

## 2018-12-15 ENCOUNTER — Other Ambulatory Visit: Payer: Self-pay

## 2018-12-15 NOTE — Progress Notes (Addendum)
Spoke w/ via phone for pre-op interview---[facility] Lab needs dos----   Cbc, urine preg           Lab results------lov cardiology dr Shawna Orleans saw for pac's and follow up prn 08-26-17 chart/epic COVID test ------12-17-2018 Arrive at -------1000 12-20-2018 NPO after ------midnight Medications to take morning of surgery -----escitalopram Diabetic medication -----n/a Patient Special Instructions ----- Pre-Op special Istructions ----- Patient verbalized understanding of instructions that were given at this phone interview. Patient denies shortness of breath, chest pain, fever, cough a this phone interview.  History reviewed with jessica zanetto pa, no labs or I stat 8 needed day of surgery

## 2018-12-17 ENCOUNTER — Other Ambulatory Visit (HOSPITAL_COMMUNITY)
Admission: RE | Admit: 2018-12-17 | Discharge: 2018-12-17 | Disposition: A | Discharge status: Home / Self Care | Payer: 59 | Source: Ambulatory Visit | Attending: Obstetrics and Gynecology | Admitting: Obstetrics and Gynecology

## 2018-12-17 DIAGNOSIS — Z20828 Contact with and (suspected) exposure to other viral communicable diseases: Secondary | ICD-10-CM | POA: Insufficient documentation

## 2018-12-17 DIAGNOSIS — Z01812 Encounter for preprocedural laboratory examination: Secondary | ICD-10-CM | POA: Diagnosis present

## 2018-12-19 LAB — NOVEL CORONAVIRUS, NAA (HOSP ORDER, SEND-OUT TO REF LAB; TAT 18-24 HRS): SARS-CoV-2, NAA: NOT DETECTED

## 2018-12-21 ENCOUNTER — Encounter (HOSPITAL_BASED_OUTPATIENT_CLINIC_OR_DEPARTMENT_OTHER): Payer: Self-pay | Admitting: *Deleted

## 2018-12-21 ENCOUNTER — Other Ambulatory Visit: Payer: Self-pay

## 2018-12-21 ENCOUNTER — Ambulatory Visit (HOSPITAL_BASED_OUTPATIENT_CLINIC_OR_DEPARTMENT_OTHER): Payer: 59 | Admitting: Physician Assistant

## 2018-12-21 ENCOUNTER — Ambulatory Visit (HOSPITAL_BASED_OUTPATIENT_CLINIC_OR_DEPARTMENT_OTHER)
Admission: RE | Admit: 2018-12-21 | Discharge: 2018-12-21 | Disposition: A | Discharge status: Home / Self Care | Payer: 59 | Attending: Obstetrics and Gynecology | Admitting: Obstetrics and Gynecology

## 2018-12-21 ENCOUNTER — Encounter (HOSPITAL_BASED_OUTPATIENT_CLINIC_OR_DEPARTMENT_OTHER)
Admission: RE | Disposition: A | Discharge status: Home / Self Care | Payer: Self-pay | Source: Home / Self Care | Attending: Obstetrics and Gynecology

## 2018-12-21 DIAGNOSIS — N920 Excessive and frequent menstruation with regular cycle: Secondary | ICD-10-CM | POA: Insufficient documentation

## 2018-12-21 DIAGNOSIS — N840 Polyp of corpus uteri: Secondary | ICD-10-CM | POA: Diagnosis not present

## 2018-12-21 DIAGNOSIS — Z79899 Other long term (current) drug therapy: Secondary | ICD-10-CM | POA: Insufficient documentation

## 2018-12-21 DIAGNOSIS — D259 Leiomyoma of uterus, unspecified: Secondary | ICD-10-CM | POA: Diagnosis not present

## 2018-12-21 DIAGNOSIS — I10 Essential (primary) hypertension: Secondary | ICD-10-CM | POA: Diagnosis not present

## 2018-12-21 DIAGNOSIS — F419 Anxiety disorder, unspecified: Secondary | ICD-10-CM | POA: Insufficient documentation

## 2018-12-21 DIAGNOSIS — Z885 Allergy status to narcotic agent status: Secondary | ICD-10-CM | POA: Insufficient documentation

## 2018-12-21 HISTORY — DX: Nausea with vomiting, unspecified: R11.2

## 2018-12-21 HISTORY — DX: Other specified postprocedural states: Z98.890

## 2018-12-21 HISTORY — DX: Cardiac arrhythmia, unspecified: I49.9

## 2018-12-21 HISTORY — PX: DILATATION & CURETTAGE/HYSTEROSCOPY WITH MYOSURE: SHX6511

## 2018-12-21 LAB — CBC
HCT: 47 % — ABNORMAL HIGH (ref 36.0–46.0)
Hemoglobin: 14.9 g/dL (ref 12.0–15.0)
MCH: 25.9 pg — ABNORMAL LOW (ref 26.0–34.0)
MCHC: 31.7 g/dL (ref 30.0–36.0)
MCV: 81.6 fL (ref 80.0–100.0)
Platelets: 220 10*3/uL (ref 150–400)
RBC: 5.76 MIL/uL — ABNORMAL HIGH (ref 3.87–5.11)
RDW: 12.6 % (ref 11.5–15.5)
WBC: 6.9 10*3/uL (ref 4.0–10.5)
nRBC: 0 % (ref 0.0–0.2)

## 2018-12-21 LAB — TYPE AND SCREEN
ABO/RH(D): B POS
Antibody Screen: NEGATIVE

## 2018-12-21 LAB — POCT PREGNANCY, URINE: Preg Test, Ur: NEGATIVE

## 2018-12-21 LAB — ABO/RH: ABO/RH(D): B POS

## 2018-12-21 SURGERY — DILATATION & CURETTAGE/HYSTEROSCOPY WITH MYOSURE
Anesthesia: General | Site: Uterus

## 2018-12-21 MED ORDER — FENTANYL CITRATE (PF) 100 MCG/2ML IJ SOLN
INTRAMUSCULAR | Status: AC
  Filled 2018-12-21: qty 2

## 2018-12-21 MED ORDER — FENTANYL CITRATE (PF) 100 MCG/2ML IJ SOLN
INTRAMUSCULAR | Status: DC | PRN
  Administered 2018-12-21: 50 ug via INTRAVENOUS
  Administered 2018-12-21 (×2): 25 ug via INTRAVENOUS

## 2018-12-21 MED ORDER — MIDAZOLAM HCL 2 MG/2ML IJ SOLN
INTRAMUSCULAR | Status: DC | PRN
  Administered 2018-12-21: 2 mg via INTRAVENOUS

## 2018-12-21 MED ORDER — VASOPRESSIN 20 UNIT/ML IV SOLN
INTRAVENOUS | Status: DC | PRN
  Administered 2018-12-21: 10 mL via INTRAMUSCULAR

## 2018-12-21 MED ORDER — ONDANSETRON HCL 4 MG/2ML IJ SOLN
INTRAMUSCULAR | Status: AC
  Filled 2018-12-21: qty 2

## 2018-12-21 MED ORDER — LIDOCAINE 2% (20 MG/ML) 5 ML SYRINGE
INTRAMUSCULAR | Status: DC | PRN
  Administered 2018-12-21: 100 mg via INTRAVENOUS

## 2018-12-21 MED ORDER — SOD CITRATE-CITRIC ACID 500-334 MG/5ML PO SOLN
30.0000 mL | ORAL | Status: DC
  Filled 2018-12-21: qty 30

## 2018-12-21 MED ORDER — PROPOFOL 10 MG/ML IV BOLUS
INTRAVENOUS | Status: DC | PRN
  Administered 2018-12-21: 150 mg via INTRAVENOUS
  Administered 2018-12-21: 50 mg via INTRAVENOUS

## 2018-12-21 MED ORDER — SCOPOLAMINE 1 MG/3DAYS TD PT72
MEDICATED_PATCH | TRANSDERMAL | Status: AC
  Filled 2018-12-21: qty 1

## 2018-12-21 MED ORDER — GABAPENTIN 300 MG PO CAPS
300.0000 mg | ORAL_CAPSULE | ORAL | Status: AC
  Administered 2018-12-21: 10:00:00 300 mg via ORAL
  Filled 2018-12-21: qty 1

## 2018-12-21 MED ORDER — LIDOCAINE 2% (20 MG/ML) 5 ML SYRINGE
INTRAMUSCULAR | Status: AC
  Filled 2018-12-21: qty 5

## 2018-12-21 MED ORDER — DEXAMETHASONE SODIUM PHOSPHATE 10 MG/ML IJ SOLN
INTRAMUSCULAR | Status: DC | PRN
  Administered 2018-12-21: 10 mg via INTRAVENOUS

## 2018-12-21 MED ORDER — FENTANYL CITRATE (PF) 100 MCG/2ML IJ SOLN
25.0000 ug | INTRAMUSCULAR | Status: DC | PRN
  Administered 2018-12-21: 25 ug via INTRAVENOUS
  Filled 2018-12-21: qty 1

## 2018-12-21 MED ORDER — ACETAMINOPHEN 500 MG PO TABS
1000.0000 mg | ORAL_TABLET | Freq: Once | ORAL | Status: DC
  Filled 2018-12-21: qty 2

## 2018-12-21 MED ORDER — PROPOFOL 10 MG/ML IV BOLUS
INTRAVENOUS | Status: AC
  Filled 2018-12-21: qty 20

## 2018-12-21 MED ORDER — KETOROLAC TROMETHAMINE 30 MG/ML IJ SOLN
INTRAMUSCULAR | Status: AC
  Filled 2018-12-21: qty 1

## 2018-12-21 MED ORDER — KETOROLAC TROMETHAMINE 15 MG/ML IJ SOLN
15.0000 mg | INTRAMUSCULAR | Status: AC
  Administered 2018-12-21: 30 mg via INTRAVENOUS
  Filled 2018-12-21: qty 1

## 2018-12-21 MED ORDER — LACTATED RINGERS IV SOLN
INTRAVENOUS | Status: DC
  Filled 2018-12-21: qty 1000

## 2018-12-21 MED ORDER — ACETAMINOPHEN 500 MG PO TABS
ORAL_TABLET | ORAL | Status: AC
  Filled 2018-12-21: qty 2

## 2018-12-21 MED ORDER — ONDANSETRON HCL 4 MG/2ML IJ SOLN
INTRAMUSCULAR | Status: DC | PRN
  Administered 2018-12-21: 4 mg via INTRAVENOUS

## 2018-12-21 MED ORDER — MIDAZOLAM HCL 2 MG/2ML IJ SOLN
INTRAMUSCULAR | Status: AC
  Filled 2018-12-21: qty 2

## 2018-12-21 MED ORDER — SODIUM CHLORIDE 0.9 % IR SOLN
Status: DC | PRN
  Administered 2018-12-21: 3000 mL

## 2018-12-21 MED ORDER — ACETAMINOPHEN 500 MG PO TABS
1000.0000 mg | ORAL_TABLET | ORAL | Status: AC
  Administered 2018-12-21: 1000 mg via ORAL
  Filled 2018-12-21: qty 2

## 2018-12-21 MED ORDER — DEXAMETHASONE SODIUM PHOSPHATE 10 MG/ML IJ SOLN
INTRAMUSCULAR | Status: AC
  Filled 2018-12-21: qty 1

## 2018-12-21 MED ORDER — LACTATED RINGERS IV SOLN
INTRAVENOUS | Status: DC
  Administered 2018-12-21: 10:00:00 via INTRAVENOUS
  Filled 2018-12-21: qty 1000

## 2018-12-21 MED ORDER — TRAMADOL HCL 50 MG PO TABS: 50.0000 mg | ORAL_TABLET | Freq: Four times a day (QID) | ORAL | 0 refills | Status: DC | PRN

## 2018-12-21 MED ORDER — GABAPENTIN 300 MG PO CAPS
ORAL_CAPSULE | ORAL | Status: AC
  Filled 2018-12-21: qty 1

## 2018-12-21 MED FILL — traMADol HCL 50 MG TABS: 50 | 7 days supply | Qty: 30 | Fill #0

## 2018-12-21 SURGICAL SUPPLY — 17 items
CANISTER SUCT 3000ML PPV (MISCELLANEOUS) IMPLANT
CATH ROBINSON RED A/P 16FR (CATHETERS) ×2 IMPLANT
COVER WAND RF STERILE (DRAPES) ×2 IMPLANT
DEVICE MYOSURE LITE (MISCELLANEOUS) IMPLANT
DEVICE MYOSURE REACH (MISCELLANEOUS) IMPLANT
GAUZE 4X4 16PLY RFD (DISPOSABLE) ×2 IMPLANT
GLOVE BIO SURGEON STRL SZ7 (GLOVE) ×2 IMPLANT
GOWN STRL REUS W/TWL LRG LVL3 (GOWN DISPOSABLE) ×2 IMPLANT
KIT PROCEDURE FLUENT (KITS) ×2 IMPLANT
MYOSURE XL FIBROID (MISCELLANEOUS) ×2
PACK VAGINAL MINOR WOMEN LF (CUSTOM PROCEDURE TRAY) ×2 IMPLANT
PAD OB MATERNITY 4.3X12.25 (PERSONAL CARE ITEMS) ×2 IMPLANT
SEAL CERVICAL OMNI LOK (ABLATOR) ×2 IMPLANT
SEAL ROD LENS SCOPE MYOSURE (ABLATOR) ×2 IMPLANT
SUT VICRYL 0 UR6 27IN ABS (SUTURE) ×2 IMPLANT
SYSTEM TISS REMOVAL MYOSURE XL (MISCELLANEOUS) ×1 IMPLANT
TOWEL OR 17X26 10 PK STRL BLUE (TOWEL DISPOSABLE) ×4 IMPLANT

## 2018-12-21 NOTE — Anesthesia Procedure Notes (Deleted)
Performed by: Lillymae Duet G, CRNA       

## 2018-12-21 NOTE — Anesthesia Postprocedure Evaluation (Signed)
Anesthesia Post Note  Patient: Kristin [name]  Procedure(s) Performed: DILATATION & CURETTAGE/HYSTEROSCOPY WITH MYOSURE (N/A Uterus)     Patient location during evaluation: PACU Anesthesia Type: General Level of consciousness: awake and alert Pain management: pain level controlled Vital Signs Assessment: post-procedure vital signs reviewed and stable Respiratory status: spontaneous breathing, nonlabored ventilation, respiratory function stable and patient connected to nasal cannula oxygen Cardiovascular status: blood pressure returned to baseline and stable Postop Assessment: no apparent nausea or vomiting Anesthetic complications: no    Last Vitals:  Vitals:   12/21/18 1245 12/21/18 1349  BP: 137/79 (!) 141/82  Pulse: 71 72  Resp: 13 18  Temp:  36.7 C  SpO2: 92% 97%    Last Pain:  Vitals:   12/21/18 1241  TempSrc:   PainSc: 1                  Terral Cooks L Lottie Siska

## 2018-12-21 NOTE — Progress Notes (Signed)
There has been no change in the patients history, status or exam since the history and physical.  Vitals:   12/15/18 0928  Weight: 93 kg  Height: 5\' 5"  (1.651 m)    No results found for this or any previous visit (from the past 72 hour(s)).  Daria Pastures

## 2018-12-21 NOTE — Discharge Instructions (Signed)
° ° ° ° ° ° °  Post Anesthesia Home Care Instructions  Activity: Get plenty of rest for the remainder of the day. A responsible individual must stay with you for 24 hours following the procedure.  For the next 24 hours, DO NOT: -Drive a car -Paediatric nurse -Drink alcoholic beverages -Take any medication unless instructed by your physician -Make any legal decisions or sign important papers.  Meals: Start with liquid foods such as gelatin or soup. Progress to regular foods as tolerated. Avoid greasy, spicy, heavy foods. If nausea and/or vomiting occur, drink only clear liquids until the nausea and/or vomiting subsides. Call your physician if vomiting continues.  Special Instructions/Symptoms: Your throat may feel dry or sore from the anesthesia or the breathing tube placed in your throat during surgery. If this causes discomfort, gargle with warm salt water. The discomfort should disappear within 24 hours.  If you had a scopolamine patch placed behind your ear for the management of post- operative nausea and/or vomiting:  1. The medication in the patch is effective for 72 hours, after which it should be removed.  Wrap patch in a tissue and discard in the trash. Wash hands thoroughly with soap and water. 2. You may remove the patch earlier than 72 hours if you experience unpleasant side effects which may include dry mouth, dizziness or visual disturbances. 3. Avoid touching the patch. Wash your hands with soap and water after contact with the patch.    Do not take any nonsteroidal anti inflammatories until after 5:30 pm today. Do not take anyTylenol until after 4:00 pm roday.

## 2018-12-21 NOTE — Op Note (Signed)
12/21/2018  11:47 AM  PATIENT:  Kristin [name]  38 y.o. female  PRE-OPERATIVE DIAGNOSIS:  Menorrhagia  POST-OPERATIVE DIAGNOSIS:  Menorrhagia  PROCEDURE:  Procedure(s): DILATATION & CURETTAGE/HYSTEROSCOPY WITH MYOSURE (N/A)  SURGEON:  Surgeon(s) and Role:    Bobbye Charleston, MD - Primary  ASSISTANTS: none   ANESTHESIA:   general  EBL:  5 mL   Hysteroscopy deficit <600 cc  LOCAL MEDICATIONS USED:  OTHER pitressin  SPECIMEN:  Source of Specimen:  uterine currettings and polyp and fibroid fragments  DISPOSITION OF SPECIMEN:  PATHOLOGY  COUNTS:  YES  TOURNIQUET:  * No tourniquets in log *  DICTATION: .Note written in EPIC  PLAN OF CARE: Discharge to home after PACU  PATIENT DISPOSITION:  PACU - hemodynamically stable.   Delay start of Pharmacological VTE agent (>24hrs) due to surgical blood loss or risk of bleeding: not applicable  Findings: multiple small polyps, shaggy endometrium and 2 cm fundal fibroid midline extending into cavity about 1-2 cm.  Technique:  After adequate anesthesia was achieved, the patient was prepped and draped in the usual sterile fashion.  The speculum was placed in the vagina and the cervix stabilized with a single-tooth tenaculum.  The cervix was dilated with pratt dilators and the hysteroscope passed inside the endometrial cavity.  The above findings were noted.  The optiseal was used to reduce fluid loss.  Pitressin was injected in 4 quadrants of the cervix.   The Myosure XL was placed inside the cavity and was used to remove polyps and then to shave the fibroid down to the level of the rest of the endometrium.   Sharp curettage was then performed and uterine curettings sent to path.  All instruments were removed from the vagina.  A stitch was placed on the cervix for hemostasis of the tenaculum site.The patient tolerated the procedure well.    Kristin [name] A

## 2018-12-21 NOTE — Transfer of Care (Signed)
  Last Vitals:  Vitals Value Taken Time  BP 149/90 12/21/18 1201  Temp    Pulse 89 12/21/18 1203  Resp 15 12/21/18 1203  SpO2 95 % 12/21/18 1203  Vitals shown include unvalidated device data.  Last Pain:  Vitals:   12/21/18 1031  TempSrc: Oral  PainSc: 0-No pain      Patients Stated Pain Goal: 4 (12/21/18 1031)  Immediate Anesthesia Transfer of Care Note  Patient: Kristin [name]  Procedure(s) Performed: Procedure(s) (LRB): DILATATION & CURETTAGE/HYSTEROSCOPY WITH MYOSURE (N/A)  Patient Location: PACU  Anesthesia Type: General  Level of Consciousness: drowsy  Airway & Oxygen Therapy: Patient Spontanous Breathing and Patient connected to nasal cannula oxygen  Post-op Assessment: Report given to PACU RN and Post -op Vital signs reviewed and stable  Post vital signs: Reviewed and stable  Complications: No apparent anesthesia complications

## 2018-12-21 NOTE — Brief Op Note (Signed)
12/21/2018  11:47 AM  PATIENT:  Kristin [name]  38 y.o. female  PRE-OPERATIVE DIAGNOSIS:  Menorrhagia  POST-OPERATIVE DIAGNOSIS:  Menorrhagia  PROCEDURE:  Procedure(s): DILATATION & CURETTAGE/HYSTEROSCOPY WITH MYOSURE (N/A)  SURGEON:  Surgeon(s) and Role:    * Bobbye Charleston, MD - Primary  ASSISTANTS: none   ANESTHESIA:   general  EBL:  5 mL   LOCAL MEDICATIONS USED:  OTHER pitressin  SPECIMEN:  Source of Specimen:  uterine currettings and polyp and fibroid fragments  DISPOSITION OF SPECIMEN:  PATHOLOGY  COUNTS:  YES  TOURNIQUET:  * No tourniquets in log *  DICTATION: .Note written in EPIC  PLAN OF CARE: Discharge to home after PACU  PATIENT DISPOSITION:  PACU - hemodynamically stable.   Delay start of Pharmacological VTE agent (>24hrs) due to surgical blood loss or risk of bleeding: not applicable

## 2018-12-21 NOTE — Anesthesia Procedure Notes (Signed)
Procedure Name: LMA Insertion Date/Time: 12/21/2018 11:00 AM Performed by: Mechele Claude, CRNA Pre-anesthesia Checklist: Patient identified, Emergency Drugs available, Suction available and Patient being monitored Patient Re-evaluated:Patient Re-evaluated prior to induction Oxygen Delivery Method: Circle system utilized Preoxygenation: Pre-oxygenation with 100% oxygen Induction Type: IV induction Ventilation: Mask ventilation without difficulty LMA: LMA inserted LMA Size: 4.0 Number of attempts: 1 Airway Equipment and Method: Bite block Placement Confirmation: positive ETCO2 Tube secured with: Tape Dental Injury: Teeth and Oropharynx as per pre-operative assessment

## 2018-12-21 NOTE — Anesthesia Preprocedure Evaluation (Signed)
Anesthesia Evaluation  Patient identified by MRN, date of birth, ID band Patient awake    Reviewed: Allergy & Precautions, NPO status , Patient's Chart, lab work & pertinent test results  History of Anesthesia Complications (+) PONV  Airway Mallampati: I  TM Distance: >3 FB Neck ROM: Full    Dental no notable dental hx. (+) Teeth Intact, Dental Advisory Given   Pulmonary neg pulmonary ROS,    Pulmonary exam normal breath sounds clear to auscultation       Cardiovascular hypertension, Pt. on medications negative cardio ROS Normal cardiovascular exam Rhythm:Regular Rate:Normal     Neuro/Psych PSYCHIATRIC DISORDERS Anxiety negative neurological ROS     GI/Hepatic Neg liver ROS, GERD  Controlled,  Endo/Other  negative endocrine ROS  Renal/GU negative Renal ROS  negative genitourinary   Musculoskeletal negative musculoskeletal ROS (+)   Abdominal   Peds  Hematology negative hematology ROS (+)   Anesthesia Other Findings   Reproductive/Obstetrics                             Anesthesia Physical Anesthesia Plan  ASA: II  Anesthesia Plan: General   Post-op Pain Management:    Induction: Intravenous  PONV Risk Score and Plan: 4 or greater and Ondansetron, Dexamethasone, Midazolam and Scopolamine patch - Pre-op  Airway Management Planned: LMA  Additional Equipment:   Intra-op Plan:   Post-operative Plan: Extubation in OR  Informed Consent: I have reviewed the patients History and Physical, chart, labs and discussed the procedure including the risks, benefits and alternatives for the proposed anesthesia with the patient or authorized representative who has indicated his/her understanding and acceptance.     Dental advisory given  Plan Discussed with: CRNA  Anesthesia Plan Comments:         Anesthesia Quick Evaluation

## 2018-12-22 LAB — SURGICAL PATHOLOGY

## 2019-01-03 MED FILL — ESCITALOPRAM 5 MG TABLET: 5 | 30 days supply | Qty: 30 | Fill #3

## 2019-02-07 MED FILL — ESCITALOPRAM 5 MG TABLET: 5 | 30 days supply | Qty: 30 | Fill #4

## 2019-03-09 MED FILL — ESCITALOPRAM 5 MG TABLET: 5 | 30 days supply | Qty: 30 | Fill #5

## 2019-04-10 MED FILL — ESCITALOPRAM 5 MG TABLET: 5 | 30 days supply | Qty: 30 | Fill #6

## 2019-04-19 ENCOUNTER — Other Ambulatory Visit: Payer: Self-pay | Admitting: Family Medicine

## 2019-04-19 ENCOUNTER — Encounter: Payer: Self-pay | Admitting: Family Medicine

## 2019-04-19 ENCOUNTER — Telehealth (INDEPENDENT_AMBULATORY_CARE_PROVIDER_SITE_OTHER): Payer: 59 | Admitting: Family Medicine

## 2019-04-19 DIAGNOSIS — F419 Anxiety disorder, unspecified: Secondary | ICD-10-CM

## 2019-04-19 MED ORDER — ESCITALOPRAM OXALATE 5 MG PO TABS: 5.0000 mg | ORAL_TABLET | Freq: Every day | ORAL | 3 refills | Status: DC

## 2019-04-19 NOTE — Progress Notes (Signed)
Virtual Visit via Video Note  I connected with Kristin [name] on 04/19/19 at  2:45 PM EDT by a video enabled telemedicine application 2/2 [id number] pandemic and verified that I am speaking with the correct person using two identifiers.  Location patient: home Location provider:work or home office Persons participating in the virtual visit: patient, provider  I discussed the limitations of evaluation and management by telemedicine and the availability of in person appointments. The patient expressed understanding and agreed to proceed.   HPI: Pt is a 39 yo female with pmh sig for HTN, IBS, GERD, anxiety, HLD who was seen for f/u.  Pt states she has been doing well.  Requesting refill on Lexapro as may have to change insurance.  States sleep, mood, appetite are good.  Pt states her anxiety is much better since being on the medication.  Pt has [place] w[place].  ROS: See pertinent positives and negatives per HPI.  Past Medical History:  Diagnosis Date  . Anxiety   . Dysrhythmia    hx pvc's none recwent per pt  . Genital warts   . GERD (gastroesophageal reflux disease)   . Hyperlipidemia   . Hypertension    situational no bp meds ever  . IBS (irritable bowel syndrome)    diarrhea prone  . PONV (postoperative nausea and vomiting)    after appendix    Past Surgical History:  Procedure Laterality Date  . APPENDECTOMY    . DILATATION & CURETTAGE/HYSTEROSCOPY WITH MYOSURE N/A 12/21/2018   Procedure: DILATATION & CURETTAGE/HYSTEROSCOPY WITH MYOSURE;  Surgeon: Bobbye Charleston, MD;  Location: [place];  Service: Gynecology;  Laterality: N/A;  . LAPAROSCOPIC APPENDECTOMY  10/04/2011   Procedure: APPENDECTOMY LAPAROSCOPIC;  Surgeon: Stark Klein, MD;  Location: [facility];  Service: General;  Laterality: N/A;  . WISDOM TOOTH EXTRACTION      Family History  Adopted: Yes  Problem Relation Age of Onset  . Diabetes Mother   . Kidney disease  Mother      Current Outpatient Medications:  .  escitalopram (LEXAPRO) 5 MG tablet, Take 1 tablet (5 mg total) by mouth daily., Disp: 90 tablet, Rfl: 3 .  traMADol (ULTRAM) 50 MG tablet, Take 1 tablet (50 mg total) by mouth every 6 (six) hours as needed., Disp: 30 tablet, Rfl: 0  EXAM:  VITALS per patient if applicable:  RR between 12-20 bpm  GENERAL: alert, oriented, appears well and in no acute distress  HEENT: atraumatic, conjunctiva clear, no obvious abnormalities on inspection of external nose and ears  NECK: normal movements of the head and neck  LUNGS: on inspection no signs of respiratory distress, breathing rate appears normal, no obvious gross SOB, gasping or wheezing  CV: no obvious cyanosis  MS: moves all visible extremities without noticeable abnormality  PSYCH/NEURO: pleasant and cooperative, no obvious depression or anxiety, speech and thought processing grossly intact  ASSESSMENT AND PLAN:  Discussed the following assessment and plan:  Anxiety  -stable -continue self care and ways to decrease stress. - Plan: escitalopram (LEXAPRO) 5 MG tablet  F/u in 4 months, sooner if needed.  I discussed the assessment and treatment plan with the patient. The patient was provided an opportunity to ask questions and all were answered. The patient agreed with the plan and demonstrated an understanding of the instructions.   The patient was advised to call back or seek an in-person evaluation if the symptoms worsen or if the  condition fails to improve as anticipated.  [name], MD

## 2019-05-08 MED FILL — ESCITALOPRAM 5 MG TABLET: 5 | 30 days supply | Qty: 30 | Fill #7

## 2019-06-08 MED FILL — ESCITALOPRAM 5 MG TABLET: 5 | 30 days supply | Qty: 30 | Fill #8

## 2019-07-06 MED FILL — ESCITALOPRAM 5 MG TABLET: 5 | 90 days supply | Qty: 90 | Fill #0

## 2019-10-10 MED FILL — ESCITALOPRAM 5 MG TABLET: 5 | 90 days supply | Qty: 90 | Fill #1

## 2020-01-01 MED FILL — ESCITALOPRAM 5 MG TABLET: 5 | 90 days supply | Qty: 90 | Fill #2

## 2020-01-15 ENCOUNTER — Other Ambulatory Visit: Payer: Self-pay

## 2020-01-15 ENCOUNTER — Encounter: Payer: Self-pay | Admitting: Family Medicine

## 2020-01-15 ENCOUNTER — Ambulatory Visit (INDEPENDENT_AMBULATORY_CARE_PROVIDER_SITE_OTHER): Payer: No Typology Code available for payment source | Admitting: Family Medicine

## 2020-01-15 VITALS — BP 118/78 | HR 90 | Temp 98.0°F | Wt 217.0 lb

## 2020-01-15 DIAGNOSIS — R5383 Other fatigue: Secondary | ICD-10-CM | POA: Diagnosis not present

## 2020-01-15 DIAGNOSIS — L821 Other seborrheic keratosis: Secondary | ICD-10-CM | POA: Diagnosis not present

## 2020-01-15 DIAGNOSIS — R0683 Snoring: Secondary | ICD-10-CM | POA: Diagnosis not present

## 2020-01-15 NOTE — Patient Instructions (Signed)
Seborrheic Keratosis °A seborrheic keratosis is a common, noncancerous (benign) skin growth. These growths are velvety, waxy, rough, tan, brown, or black spots that appear on the skin. These skin growths can be flat or raised, and scaly. °What are the causes? °The cause of this condition is not known. °What increases the risk? °You are more likely to develop this condition if you: °· Have a family history of seborrheic keratosis. °· Are 50 or older. °· Are pregnant. °· Have had estrogen replacement therapy. °What are the signs or symptoms? °Symptoms of this condition include growths on the face, chest, shoulders, back, or other areas. These growths: °· Are usually painless, but may become irritated and itchy. °· Can be yellow, brown, black, or other colors. °· Are slightly raised or have a flat surface. °· Are sometimes rough or wart-like in texture. °· Are often velvety or waxy on the surface. °· Are round or oval-shaped. °· Often occur in groups, but may occur as a single growth. °How is this diagnosed? °This condition is diagnosed with a medical history and physical exam. °· A sample of the growth may be tested (skin biopsy). °· You may need to see a skin specialist (dermatologist). °How is this treated? °Treatment is not usually needed for this condition, unless the growths are irritated or bleed often. °· You may also choose to have the growths removed if you do not like their appearance. °? Most commonly, these growths are treated with a procedure in which liquid nitrogen is applied to "freeze" off the growth (cryosurgery). °? They may also be burned off with electricity (electrocautery) or removed by scraping (curettage). °Follow these instructions at home: °· Watch your growth for any changes. °· Keep all follow-up visits as told by your health care provider. This is important. °· Do not scratch or pick at the growth or growths. This can cause them to become irritated or infected. °Contact a health care  provider if: °· You suddenly have many new growths. °· Your growth bleeds, itches, or hurts. °· Your growth suddenly becomes larger or changes color. °Summary °· A seborrheic keratosis is a common, noncancerous (benign) skin growth. °· Treatment is not usually needed for this condition, unless the growths are irritated or bleed often. °· Watch your growth for any changes. °· Contact a health care provider if you suddenly have many new growths or your growth suddenly becomes larger or changes color. °· Keep all follow-up visits as told by your health care provider. This is important. °This information is not intended to replace advice given to you by your health care provider. Make sure you discuss any questions you have with your health care provider. °Document Revised: 05/13/2017 Document Reviewed: 05/13/2017 °Elsevier Patient Education © 2020 [employer] ° °

## 2020-01-15 NOTE — Progress Notes (Signed)
Established Patient Office Visit  Subjective:  Patient ID: Kristin [name], female    DOB: 02/13/1980  Age: 40 y.o. MRN: [id number]  CC: No chief complaint on file.   HPI [name] presents for evaluation of skin lesion right upper back area.  She first noticed this couple years ago.  Slowly growing in size.  No itching or bleeding.  She noticed that the edges were slightly irregular.  No personal history of skin cancer.  She is adopted so family history is unknown.  Other issue is increased fatigue and snoring.  She states her girlfriend has observed apnea episodes at night but not sure of duration.  She does not have any major daytime somnolence but does not feel rested when she gets up in the mornings.  She is concerned about sleep apnea.  No hypertension history.  No peripheral edema.  Past Medical History:  Diagnosis Date  . Anxiety   . Dysrhythmia    hx pvc's none recwent per pt  . Genital warts   . GERD (gastroesophageal reflux disease)   . Hyperlipidemia   . Hypertension    situational no bp meds ever  . IBS (irritable bowel syndrome)    diarrhea prone  . PONV (postoperative nausea and vomiting)    after appendix    Past Surgical History:  Procedure Laterality Date  . APPENDECTOMY    . DILATATION & CURETTAGE/HYSTEROSCOPY WITH MYOSURE N/A 12/21/2018   Procedure: DILATATION & CURETTAGE/HYSTEROSCOPY WITH MYOSURE;  Surgeon: Carrington Clamp, MD;  Location: [facility] [place];  Service: Gynecology;  Laterality: N/A;  . LAPAROSCOPIC APPENDECTOMY  10/04/2011   Procedure: APPENDECTOMY LAPAROSCOPIC;  Surgeon: Almond Lint, MD;  Location: [facility];  Service: General;  Laterality: N/A;  . WISDOM TOOTH EXTRACTION      Family History  Adopted: Yes  Problem Relation Age of Onset  . Diabetes Mother   . Kidney disease Mother     Social History   Socioeconomic History  . Marital status: Single    Spouse name: Not on file  . Number of children: Not on file  . Years  of education: Not on file  . Highest education level: Not on file  Occupational History  . Not on file  Tobacco Use  . Smoking status: Never Smoker  . Smokeless tobacco: Never Used  Vaping Use  . Vaping Use: Never used  Substance and Sexual Activity  . Alcohol use: No  . Drug use: No  . Sexual activity: Never  Other Topics Concern  . 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 escitalopram (LEXAPRO) 5 MG tablet Take 1 tablet (5 mg total) by mouth daily. 90 tablet 3  . traMADol (ULTRAM) 50 MG tablet Take 1 tablet (50 mg total) by mouth every 6 (six) hours as needed. 30 tablet 0   No facility-administered medications prior to visit.    Allergies  Allergen Reactions  . Morphine And Related Nausea Only  . Vicodin [Hydrocodone-Acetaminophen] Other (See Comments)  Patient stated that gave her panic attack    ROS Review of Systems  Constitutional: Positive for fatigue. Negative for appetite change and unexpected weight change.  Respiratory: Negative for shortness of breath.   Cardiovascular: Negative for chest pain.      Objective:    Physical Exam Vitals reviewed.  Constitutional:      Appearance: Normal appearance.  Cardiovascular:     Rate and Rhythm: Normal rate and regular rhythm.  Skin:    Comments: Upper back reveals slightly raised scaly approximately 7 mm brownish lesion consistent with seborrheic keratosis.  This has slightly verrucous appearance on the surface  Neurological:     Mental Status: She is alert.     BP 118/78  (BP Location: Left Arm, Patient Position: Sitting, Cuff Size: Large)   Pulse 90   Temp 98 F (36.7 C) (Oral)   Wt 217 lb (98.4 kg)   SpO2 98%   BMI 36.11 kg/m  Wt Readings from Last 3 Encounters:  01/15/20 217 lb (98.4 kg)  12/21/18 208 lb 2 oz (94.4 kg)  07/06/18 203 lb (92.1 kg)     Health Maintenance Due  Topic Date Due  . Hepatitis C Screening  Never done  . COVID-19 Vaccine (1) Never done  . TETANUS/TDAP  07/13/2018  . PAP SMEAR-Modifier  01/02/2019  . INFLUENZA VACCINE  08/13/2019    There are no preventive care reminders to display for this patient.  Lab Results  Component Value Date   TSH 1.64 07/02/2017   Lab Results  Component Value Date   WBC 6.9 12/21/2018   HGB 14.9 12/21/2018   HCT 47.0 (H) 12/21/2018   MCV 81.6 12/21/2018   PLT 220 12/21/2018   Lab Results  Component Value Date   NA 137 07/02/2017   K 4.2 07/02/2017   CO2 27 07/02/2017   GLUCOSE 94 07/02/2017   BUN 12 07/02/2017   CREATININE 0.83 07/02/2017   BILITOT 0.5 07/02/2017   ALKPHOS 66 07/02/2017   AST 14 07/02/2017   ALT 9 07/02/2017   PROT 7.3 07/02/2017   ALBUMIN 4.5 07/02/2017   CALCIUM 9.4 07/02/2017   GFR 82.59 07/02/2017   Lab Results  Component Value Date   CHOL 205 (H) 07/02/2017   Lab Results  Component Value Date   HDL 42.70 07/02/2017   Lab Results  Component Value Date   LDLCALC 127 (H) 07/02/2017   Lab Results  Component Value Date   TRIG 181.0 (H) 07/02/2017   Lab Results  Component Value Date   CHOLHDL 5 07/02/2017   Lab Results  Component Value Date   HGBA1C 5.6 07/02/2017      Assessment & Plan:   Problem List Items Addressed This Visit   None   Visit Diagnoses    Seborrheic keratosis    -  Primary   Snoring       Fatigue, unspecified type        -Reassurance regarding benign seborrheic keratosis -We will set up referral to pulmonary for evaluation of obstructive sleep apnea  No orders of the defined types were placed in this  encounter.   Follow-up: No follow-ups on file.    Carolann Littler, MD

## 2020-02-21 ENCOUNTER — Institutional Professional Consult (permissible substitution): Payer: No Typology Code available for payment source | Admitting: Pulmonary Disease

## 2020-03-20 ENCOUNTER — Institutional Professional Consult (permissible substitution): Payer: No Typology Code available for payment source | Admitting: Pulmonary Disease

## 2020-04-02 MED FILL — ESCITALOPRAM 5 MG TABLET: 5 | 90 days supply | Qty: 90 | Fill #3

## 2020-04-22 ENCOUNTER — Institutional Professional Consult (permissible substitution): Payer: No Typology Code available for payment source | Admitting: Pulmonary Disease

## 2020-07-01 ENCOUNTER — Other Ambulatory Visit (HOSPITAL_COMMUNITY): Payer: Self-pay

## 2020-07-01 ENCOUNTER — Other Ambulatory Visit: Payer: Self-pay | Admitting: Family Medicine

## 2020-07-01 DIAGNOSIS — F419 Anxiety disorder, unspecified: Secondary | ICD-10-CM

## 2020-07-03 ENCOUNTER — Other Ambulatory Visit (HOSPITAL_COMMUNITY): Payer: Self-pay

## 2020-07-05 ENCOUNTER — Other Ambulatory Visit: Payer: Self-pay | Admitting: Family Medicine

## 2020-07-05 ENCOUNTER — Other Ambulatory Visit (HOSPITAL_COMMUNITY): Payer: Self-pay

## 2020-07-05 DIAGNOSIS — F419 Anxiety disorder, unspecified: Secondary | ICD-10-CM

## 2020-07-08 ENCOUNTER — Other Ambulatory Visit: Payer: Self-pay | Admitting: Family Medicine

## 2020-07-08 ENCOUNTER — Telehealth: Payer: Self-pay | Admitting: Family Medicine

## 2020-07-08 ENCOUNTER — Other Ambulatory Visit (HOSPITAL_COMMUNITY): Payer: Self-pay

## 2020-07-08 DIAGNOSIS — F419 Anxiety disorder, unspecified: Secondary | ICD-10-CM

## 2020-07-08 NOTE — Telephone Encounter (Signed)
Patient called asking for a refill of escitalopram (LEXAPRO) 5mg  tablet to be sent to Kristin [name] ([place]) on [address] [name].   Patient states that she is down to her last tablet.  Please advise.

## 2020-07-09 ENCOUNTER — Other Ambulatory Visit: Payer: Self-pay

## 2020-07-09 ENCOUNTER — Other Ambulatory Visit: Payer: Self-pay | Admitting: Family Medicine

## 2020-07-09 ENCOUNTER — Other Ambulatory Visit (HOSPITAL_COMMUNITY): Payer: Self-pay

## 2020-07-09 DIAGNOSIS — F419 Anxiety disorder, unspecified: Secondary | ICD-10-CM

## 2020-07-10 ENCOUNTER — Other Ambulatory Visit (HOSPITAL_COMMUNITY): Payer: Self-pay

## 2020-07-10 ENCOUNTER — Ambulatory Visit: Payer: Managed Care, Other (non HMO) | Admitting: Family Medicine

## 2020-07-10 ENCOUNTER — Encounter: Payer: Self-pay | Admitting: Family Medicine

## 2020-07-10 VITALS — BP 130/84 | HR 88 | Temp 98.5°F | Wt 224.0 lb

## 2020-07-10 DIAGNOSIS — R0683 Snoring: Secondary | ICD-10-CM | POA: Diagnosis not present

## 2020-07-10 DIAGNOSIS — F419 Anxiety disorder, unspecified: Secondary | ICD-10-CM | POA: Diagnosis not present

## 2020-07-10 DIAGNOSIS — E781 Pure hyperglyceridemia: Secondary | ICD-10-CM

## 2020-07-10 DIAGNOSIS — N939 Abnormal uterine and vaginal bleeding, unspecified: Secondary | ICD-10-CM

## 2020-07-10 MED ORDER — ESCITALOPRAM OXALATE 5 MG PO TABS
5.0000 mg | ORAL_TABLET | Freq: Every day | ORAL | 3 refills | Status: DC
  Filled 2020-07-10: qty 30, 30d supply, fill #0
  Filled 2020-08-02: qty 30, 30d supply, fill #1

## 2020-07-10 NOTE — Progress Notes (Signed)
Subjective:    Patient ID: Kristin [name], female    DOB: 02/10/1980, 40 y.o.   MRN: [id number]  Chief Complaint  Patient presents with   Medication Refill    HPI Patient was seen today for f/u.  Pt doing well on Lexapro 5 mg or anxiety.  Sleep is good, energy, and mood.  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  Pt has appt with OB/Gyn next wk for AUB, mostly spotting x 3 months.  Has similar issue in the past with a polyp that was removed.  Denies SOB, dizziness, HAs, weakness.  Sleep study next wk.  Pt's gf told her she snores and gasps for air. Side sleeper.  Some wt gain.  Had cholesterol checked at work in March or April 2022 and notes trigylcerides were elevated in the 350s+.  Eating more fast food.  Drinking 2 bottles of water per day.  Not exercising currently. Past Medical History:  Diagnosis Date   Anxiety    Dysrhythmia    hx pvc's none recwent per pt   Genital warts    GERD (gastroesophageal reflux disease)    Hyperlipidemia    Hypertension    situational no bp meds ever   IBS (irritable bowel syndrome)    diarrhea prone   PONV (postoperative nausea and vomiting)    after appendix    No Active Allergies  ROS General: Denies fever, chills, night sweats, changes in appetite  +wt gain HEENT: Denies headaches, ear pain, changes in vision, rhinorrhea, sore throat CV: Denies CP, palpitations, SOB, orthopnea  Pulm: Denies SOB, cough, wheezing +snoring, gasping for air at night GI: Denies abdominal pain, nausea, vomiting, diarrhea, constipation GU: Denies dysuria, hematuria, frequency, vaginal discharge +spotting x 3 months Msk: Denies muscle cramps, joint pains Neuro: Denies weakness, numbness, tingling Skin: Denies rashes, bruising  Psych: Denies depression,hallucinations  +anxiety    Objective:    Blood pressure 130/84, pulse 88, temperature 98.5 F (36.9 C), temperature source Oral, weight 224 lb (101.6 kg), SpO2 98 %.  Gen.  Pleasant, well-nourished, in no distress, normal affect   HEENT: Stella/AT, face symmetric, conjunctiva clear, no scleral icterus, PERRLA, EOMI, nares patent without drainage Lungs: no accessory muscle use Cardiovascular: RRR, no peripheral edema Musculoskeletal: No deformities, no cyanosis or clubbing, normal tone Neuro:  A&Ox3, CN II-XII intact, normal gait Skin:  Warm, no lesions/ rash   Wt Readings from Last 3 Encounters:  07/10/20 224 lb (101.6 kg)  01/15/20 217 lb (98.4 kg)  12/21/18 208 lb 2 oz (94.4 kg)    Lab Results  Component Value Date   WBC 6.9 12/21/2018   HGB 14.9 12/21/2018   HCT 47.0 (H) 12/21/2018   PLT 220 12/21/2018   GLUCOSE 94 07/02/2017   CHOL 205 (H) 07/02/2017   TRIG 181.0 (H) 07/02/2017   HDL 42.70 07/02/2017   LDLDIRECT 106.8 08/31/2012   LDLCALC 127 (H) 07/02/2017   ALT 9 07/02/2017   AST 14 07/02/2017   NA 137 07/02/2017   K 4.2 07/02/2017   CL 103 07/02/2017   CREATININE 0.83 07/02/2017   BUN 12 07/02/2017   CO2 27 07/02/2017   TSH 1.64 07/02/2017   HGBA1C 5.6 07/02/2017    Assessment/Plan:  Anxiety  -stable -GAD 7 score 1 -PHQ-9 score 0 -Discussed self-care and ways to reduce anxiety including deep breathing and mindfulness -Consider restarting counseling -Continue Lexapro 5 mg daily -Given handout - Plan: escitalopram (LEXAPRO) 5 MG tablet  Abnormal uterine bleeding (AUB) -Spotting x3 months -Discussed possible causes including polyps, endometrial hyperplasia, fibroids -Patient encouraged to keep follow-up appointment with OB/GYN -Given handout -Discussed obtaining CBC to evaluate for anemia.  Patient wishes to wait for upcoming CPE in the next few weeks  Snoring -Patient encouraged to keep appointment for sleep study -Discussed modifications including weight loss and changing position while sleeping -Continue to monitor  Pure hypertriglyceridemia -Patient reports triglycerides >350 several months ago -Discussed lifestyle  modifications including reading labels, decreasing the amount of saturated fats and fast food eating, and exercising -Given handout -We will recheck lipid panel in 2 weeks  F/u in the next for wks for CPE  Grier Mitts, MD

## 2020-07-12 ENCOUNTER — Other Ambulatory Visit (HOSPITAL_COMMUNITY): Payer: Self-pay

## 2020-07-12 MED ORDER — LO LOESTRIN FE 1 MG-10 MCG / 10 MCG PO TABS
1.0000 | ORAL_TABLET | Freq: Every day | ORAL | 4 refills | Status: DC
  Filled 2020-07-12: qty 28, 28d supply, fill #0
  Filled 2020-08-02: qty 28, 28d supply, fill #1

## 2020-07-16 ENCOUNTER — Encounter: Payer: Self-pay | Admitting: Family Medicine

## 2020-07-21 NOTE — Progress Notes (Signed)
07/22/20- 40 yoF never smoker for sleep evaluation courtesy of Dr Carolann Littler with concern of fatigue and snoring Medical problem list includes HTN, IBS, GERD, Obesity, PCOS,  Epworth score- 0 Body weight today- 225 lbs Covid vax-2 Phizer, 1 Moderna -----Snore, witnessed apneas, fatigue  Always tired x 20 years. Told  by girlfriend that she snores and witnessed apnea. No ENT surgery or lung disease.Occ extra beats, otw no cardiac issues.  Adopted. No parasomnias. No sleep meds. 1 Pepsi/ day. "Anxious"- avoids naps and coffee.   Prior to Admission medications   Medication Sig Start Date End Date Taking? Authorizing Provider  escitalopram (LEXAPRO) 5 MG tablet Take 1 tablet (5 mg total) by mouth daily. 07/10/20  Yes [name], MD  Norethindrone-Ethinyl Estradiol-Fe Biphas (LO LOESTRIN FE) 1 MG-10 MCG / 10 MCG tablet Take 1 tablet by mouth daily. 07/12/20  Yes    Past Medical History:  Diagnosis Date   Anxiety    Dysrhythmia    hx pvc's none recwent per pt   Genital warts    GERD (gastroesophageal reflux disease)    Hyperlipidemia    Hypertension    situational no bp meds ever   IBS (irritable bowel syndrome)    diarrhea prone   PONV (postoperative nausea and vomiting)    after appendix   Past Surgical History:  Procedure Laterality Date   APPENDECTOMY     DILATATION & CURETTAGE/HYSTEROSCOPY WITH MYOSURE N/A 12/21/2018   Procedure: DILATATION & CURETTAGE/HYSTEROSCOPY WITH MYOSURE;  Surgeon: Bobbye Charleston, MD;  Location: [facility];  Service: Gynecology;  Laterality: N/A;   LAPAROSCOPIC APPENDECTOMY  10/04/2011   Procedure: APPENDECTOMY LAPAROSCOPIC;  Surgeon: Stark Klein, MD;  Location: [facility];  Service: General;  Laterality: N/A;   WISDOM TOOTH EXTRACTION     Family History  Adopted: Yes  Problem Relation Age of Onset   Diabetes Mother    Kidney disease Mother    Social History   Socioeconomic History   Marital status: Single    Spouse name:  Not on file   Number of children: Not on file   Years of education: Not on file   Highest education level: Not on file  Occupational History   Not on file  Tobacco Use   Smoking status: Never   Smokeless tobacco: Never  Vaping Use   Vaping Use: Never used  Substance and Sexual Activity   Alcohol use: No   Drug use: No   Sexual activity: Never  Other Topics Concern   Not on file  Social History Narrative   Female partner   no kids   From [place] originally   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 HEENT:    headaches, difficulty swallowing, tooth/dental problems, sore throat,       sneezing, itching, ear ache, nasal congestion, post nasal drip, snoring CV:    chest pain, orthopnea, PND, swelling in lower extremities, anasarca,  dizziness, +palpitations Resp:   shortness of breath with exertion or at rest.                productive cough,   non-productive cough, coughing up of blood.              change in color of mucus.  wheezing.   Skin:    rash or lesions. GI:+heartburn, indigestion, abdominal pain, nausea, vomiting, diarrhea,                 change in bowel habits, loss of appetite GU: dysuria, change in color of urine, no urgency or frequency.   flank pain. MS:   joint pain, stiffness, decreased range of motion, back pain. Neuro-     nothing unusual Psych:  change in mood or affect.  depression or +anxiety.   memory loss.   OBJ- Physical Exam General- Alert, Oriented, Affect-appropriate, Distress- none acute, + obese Skin- rash-none, lesions- none, excoriation-  none Lymphadenopathy- none Head- atraumatic            Eyes- Gross vision intact, PERRLA, conjunctivae and secretions clear            Ears- Hearing, canals-normal            Nose- Clear, no-Septal dev, mucus, polyps, erosion, perforation             Throat- Mallampati II I, mucosa clear , drainage- none, tonsils+, +teeth Neck- flexible , trachea midline, no stridor , thyroid nl, carotid no bruit Chest - symmetrical excursion , unlabored           Heart/CV- RRR , no murmur , no gallop  , no rub, nl s1 s2                           - JVD- none , edema- none, stasis changes- none, varices- none           Lung- clear to P&A, wheeze- none, cough- none , dullness-none, rub- none           Chest wall-  Abd-  Br/ Gen/ Rectal- Not done, not indicated Extrem- cyanosis- none, clubbing, none, atrophy- none, strength- nl Neuro- grossly intact to observation

## 2020-07-22 ENCOUNTER — Encounter: Payer: Self-pay | Admitting: Family Medicine

## 2020-07-22 ENCOUNTER — Ambulatory Visit (INDEPENDENT_AMBULATORY_CARE_PROVIDER_SITE_OTHER): Payer: Managed Care, Other (non HMO) | Admitting: Family Medicine

## 2020-07-22 ENCOUNTER — Encounter: Payer: Self-pay | Admitting: Internal Medicine

## 2020-07-22 ENCOUNTER — Ambulatory Visit (INDEPENDENT_AMBULATORY_CARE_PROVIDER_SITE_OTHER): Payer: Managed Care, Other (non HMO) | Admitting: Internal Medicine

## 2020-07-22 ENCOUNTER — Other Ambulatory Visit: Payer: Self-pay

## 2020-07-22 VITALS — BP 138/90 | HR 96 | Temp 97.6°F | Ht 66.0 in | Wt 223.4 lb

## 2020-07-22 VITALS — BP 124/84 | HR 84 | Temp 98.9°F | Ht 66.0 in | Wt 225.6 lb

## 2020-07-22 DIAGNOSIS — E782 Mixed hyperlipidemia: Secondary | ICD-10-CM | POA: Diagnosis not present

## 2020-07-22 DIAGNOSIS — Z0000 Encounter for general adult medical examination without abnormal findings: Secondary | ICD-10-CM

## 2020-07-22 DIAGNOSIS — Z1159 Encounter for screening for other viral diseases: Secondary | ICD-10-CM

## 2020-07-22 DIAGNOSIS — N939 Abnormal uterine and vaginal bleeding, unspecified: Secondary | ICD-10-CM

## 2020-07-22 DIAGNOSIS — G4733 Obstructive sleep apnea (adult) (pediatric): Secondary | ICD-10-CM | POA: Insufficient documentation

## 2020-07-22 DIAGNOSIS — E6609 Other obesity due to excess calories: Secondary | ICD-10-CM

## 2020-07-22 DIAGNOSIS — Z6837 Body mass index (BMI) 37.0-37.9, adult: Secondary | ICD-10-CM

## 2020-07-22 DIAGNOSIS — R0683 Snoring: Secondary | ICD-10-CM | POA: Diagnosis not present

## 2020-07-22 DIAGNOSIS — Z6835 Body mass index (BMI) 35.0-35.9, adult: Secondary | ICD-10-CM

## 2020-07-22 LAB — CBC WITH DIFFERENTIAL/PLATELET
Basophils Absolute: 0 10*3/uL (ref 0.0–0.1)
Basophils Relative: 0.6 % (ref 0.0–3.0)
Eosinophils Absolute: 0.1 10*3/uL (ref 0.0–0.7)
Eosinophils Relative: 1.1 % (ref 0.0–5.0)
HCT: 42.1 % (ref 36.0–46.0)
Hemoglobin: 14.2 g/dL (ref 12.0–15.0)
Lymphocytes Relative: 30.3 % (ref 12.0–46.0)
Lymphs Abs: 1.7 10*3/uL (ref 0.7–4.0)
MCHC: 33.7 g/dL (ref 30.0–36.0)
MCV: 78.1 fl (ref 78.0–100.0)
Monocytes Absolute: 0.4 10*3/uL (ref 0.1–1.0)
Monocytes Relative: 6.3 % (ref 3.0–12.0)
Neutro Abs: 3.5 10*3/uL (ref 1.4–7.7)
Neutrophils Relative %: 61.7 % (ref 43.0–77.0)
Platelets: 177 10*3/uL (ref 150.0–400.0)
RBC: 5.39 Mil/uL — ABNORMAL HIGH (ref 3.87–5.11)
RDW: 13.7 % (ref 11.5–15.5)
WBC: 5.7 10*3/uL (ref 4.0–10.5)

## 2020-07-22 LAB — BASIC METABOLIC PANEL
BUN: 15 mg/dL (ref 6–23)
CO2: 25 mEq/L (ref 19–32)
Calcium: 9 mg/dL (ref 8.4–10.5)
Chloride: 107 mEq/L (ref 96–112)
Creatinine, Ser: 1.01 mg/dL (ref 0.40–1.20)
GFR: 70.26 mL/min (ref >60.00)
Glucose, Bld: 94 mg/dL (ref 70–99)
Potassium: 4 mEq/L (ref 3.5–5.1)
Sodium: 141 mEq/L (ref 135–145)

## 2020-07-22 LAB — LIPID PANEL
Cholesterol: 213 mg/dL — ABNORMAL HIGH (ref 0–200)
HDL: 41.7 mg/dL (ref >39.00)
NonHDL: 170.81
Total CHOL/HDL Ratio: 5
Triglycerides: 223 mg/dL — ABNORMAL HIGH (ref 0.0–149.0)
VLDL: 44.6 mg/dL — ABNORMAL HIGH (ref 0.0–40.0)

## 2020-07-22 LAB — TSH: TSH: 4.13 u[IU]/mL (ref 0.35–5.50)

## 2020-07-22 LAB — HEMOGLOBIN A1C: Hgb A1c MFr Bld: 5.8 % (ref 4.6–6.5)

## 2020-07-22 LAB — LDL CHOLESTEROL, DIRECT: Direct LDL: 142 mg/dL

## 2020-07-22 LAB — T4, FREE: Free T4: 0.66 ng/dL (ref 0.60–1.60)

## 2020-07-22 NOTE — Assessment & Plan Note (Signed)
Probable OSA based on hx and exam. Appropriate discussion incl medical concerns, safe driving, testing, treatment options. Plan- sleep study,then possibly CPAP or OAP

## 2020-07-22 NOTE — Patient Instructions (Signed)
Order- schedule  home sleep test    dx OSA  Please call us for results and recommendations about 2 weeks after your sleep test. Results will not go into My Chart.

## 2020-07-22 NOTE — Assessment & Plan Note (Signed)
Normal body weight will help slep disordered breathing. Consider Healthy Weight and [facility]

## 2020-07-22 NOTE — Progress Notes (Signed)
Subjective:     Kristin [name] is a 40 y.o. female and is here for a comprehensive physical exam.  Pt started lo loesterin for AUB.  Bleeding stopped for about 4 days since restarted.  Taking pills daily at 8:11 PM.  Seen by OB/GYN last week.  Stable on Lexapro 5 mg.  Notes whitecoat hypertension.  Denies headaches, dizziness, lightheadedness, chest pain, SOB.  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   Social Determinants of Radio broadcast assistant Strain: Not on file  Food Insecurity: Not on file  Transportation Needs: Not on file  Physical Activity: Not on file  Stress: Not on file  Social Connections: Not on file  Intimate Partner Violence: Not on file   Health Maintenance  Topic Date Due   Hepatitis C Screening  Never done   PAP SMEAR-Modifier  01/09/2021 (Originally 01/02/2019)   Pneumococcal Vaccine 14-7 Years old (1 - PCV) 07/10/2021 (Originally 05/01/1987)   INFLUENZA VACCINE  08/12/2020   COVID-19 Vaccine (4 - Booster for [place] series) 09/20/2020   TETANUS/TDAP  07/10/2028   HIV Screening  Completed   HPV VACCINES  Aged Out    The following portions of the patient's history were reviewed and updated as appropriate: allergies, current medications, past family history, past medical history, past social history, past surgical history, and problem list.  Review of Systems Pertinent items noted in HPI and remainder of  comprehensive ROS otherwise negative.   Objective:    BP 138/90 (BP Location: Left Arm, Patient Position: Sitting, Cuff Size: Normal)   Pulse 96   Temp 97.6 F (36.4 C) (Oral)   Ht 5\' 6"  (1.676 m)   Wt 223 lb 6.4 oz (101.3 kg)   SpO2 96%   BMI 36.06 kg/m  General appearance: alert and cooperative Head: Normocephalic, without obvious abnormality, atraumatic Eyes: conjunctivae/corneas clear. PERRL, EOM's intact. Fundi benign. Ears: normal TM's and external ear canals both ears Nose: Nares normal. Septum midline. Mucosa normal. No drainage or sinus tenderness. Throat: lips, mucosa, and tongue normal; teeth and gums normal Neck: no adenopathy, no carotid bruit, no JVD, supple, symmetrical, trachea midline, and thyroid not enlarged, symmetric, no tenderness/mass/nodules Lungs: clear to auscultation bilaterally Heart: regular rate and rhythm, S1, S2 normal, no murmur, click, rub or gallop Abdomen: soft, non-tender; bowel sounds normal; no masses,  no organomegaly Extremities: extremities normal, atraumatic, no cyanosis or edema Pulses: 2+ and symmetric Skin: Skin color, texture, turgor normal. No rashes or lesions Lymph nodes: Cervical, supraclavicular, and axillary nodes normal. Neurologic: Alert and oriented X 3, normal strength and tone. Normal symmetric reflexes. Normal coordination and gait    Assessment:    Healthy female exam.      Plan:    Anticipatory guidance given including wearing seatbelts, smoke detectors in the  home, increasing physical activity, increasing p.o. intake of water and vegetables. -Obtain labs -Immunizations reviewed -Pap up-to-date, done 06/2018 with OB/GYN -Given handout -Next CPE in 1 year See After Visit Summary for Counseling Recommendations  Mixed HLD -Lifestyle modifications - Plan: Lipid panel  Abnormal uterine bleeding (AUB) -Asymptomatic -Continued bleeding with Lo Loestrin OCPs -Advised to contact OB/GYN - Plan: CBC with  Differential/Platelet, TSH, T4, Free  Class 2 obesity due to excess calories without serious comorbidity with body mass index (BMI) of 37.0 to 37.9 in adult  -Lifestyle modifications encouraged - Plan: Hemoglobin A1c  Encounter for hepatitis C screening test for low risk patient - Plan: Hep C Antibody  Follow-up as needed  Grier Mitts, MD

## 2020-07-23 LAB — HEPATITIS C ANTIBODY
Hepatitis C Ab: NONREACTIVE
SIGNAL TO CUT-OFF: 0.02 (ref <1.00)

## 2020-08-02 ENCOUNTER — Other Ambulatory Visit (HOSPITAL_COMMUNITY): Payer: Self-pay

## 2020-08-13 ENCOUNTER — Encounter: Payer: Self-pay | Admitting: Family Medicine

## 2020-08-16 ENCOUNTER — Other Ambulatory Visit: Payer: Self-pay

## 2020-08-16 DIAGNOSIS — F419 Anxiety disorder, unspecified: Secondary | ICD-10-CM

## 2020-08-16 MED ORDER — ESCITALOPRAM OXALATE 5 MG PO TABS: 5.0000 mg | ORAL_TABLET | Freq: Every day | ORAL | 3 refills | Status: DC

## 2020-09-06 ENCOUNTER — Telehealth: Payer: Managed Care, Other (non HMO) | Admitting: Family Medicine

## 2020-09-06 NOTE — Progress Notes (Deleted)
Called pt to get ready for MyChart visit, went to vm. Left message for pt to call the office and that I would be sending a link for the visit. Sent link for pt to join virtual. Patient was not seen on virtual visit by 9:20 am.

## 2020-10-14 NOTE — Telephone Encounter (Signed)
I have printed order and will have to check with [facility] to see if it has been precerted.

## 2020-10-14 NOTE — Telephone Encounter (Signed)
PCC's  please advise. Thanks   Hi! I had a sleep consultation with Dr. Annamaria Boots in July 2022. I was to receive in home equipment to complete my in home sleep study, he mentioned office would call when ready to pick up. I have not heard anything. I have follow up with Dr. Annamaria Boots on Oct 17 for results. Thank you!

## 2020-10-16 NOTE — Telephone Encounter (Signed)
No prior auth was required.  I have called pt & scheduled her hst.  Nothing further needed.  I will route back to triage so they can close MyChart message.

## 2020-10-17 ENCOUNTER — Other Ambulatory Visit: Payer: Self-pay

## 2020-10-17 ENCOUNTER — Ambulatory Visit: Payer: Managed Care, Other (non HMO)

## 2020-10-17 DIAGNOSIS — G4733 Obstructive sleep apnea (adult) (pediatric): Secondary | ICD-10-CM | POA: Diagnosis not present

## 2020-10-28 ENCOUNTER — Ambulatory Visit: Payer: Managed Care, Other (non HMO) | Admitting: Internal Medicine

## 2020-11-08 ENCOUNTER — Telehealth: Payer: Self-pay | Admitting: Internal Medicine

## 2020-11-08 DIAGNOSIS — R0683 Snoring: Secondary | ICD-10-CM

## 2020-11-08 DIAGNOSIS — G4733 Obstructive sleep apnea (adult) (pediatric): Secondary | ICD-10-CM

## 2020-11-08 NOTE — Telephone Encounter (Signed)
Patient is aware of below message and voiced her understanding.  She is aware that our office will contact her once study has been read.

## 2020-11-08 NOTE — Telephone Encounter (Signed)
Technical problem- I couldn't open study and have asked [facility] to work on it.

## 2020-11-08 NOTE — Telephone Encounter (Signed)
Called and spoke, who is requesting 10/17/2020 HST results.   Dr. Annamaria Boots, please advise. thanks

## 2020-11-08 NOTE — Telephone Encounter (Signed)
Lm for patient.  

## 2020-11-12 DIAGNOSIS — G4733 Obstructive sleep apnea (adult) (pediatric): Secondary | ICD-10-CM | POA: Diagnosis not present

## 2020-11-13 NOTE — Telephone Encounter (Signed)
Called patient back. I let her know that on our end, it does not look the HST had been loaded to her chart yet. She verbalized understanding.   CY, have you received the printed HST results from the [facility]?

## 2020-11-13 NOTE — Telephone Encounter (Signed)
Study has been read. I think [facility]/ [facility] has it. When available I will be able to message for patient.

## 2020-11-21 NOTE — Telephone Encounter (Signed)
Dr Annamaria Boots, pt called back again. Have you had a chance to look at this yet?

## 2020-11-21 NOTE — Telephone Encounter (Signed)
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  Recommend we order new D?ME, new CPAP auto 5-20, mask of choice, humidifier, supplies, AirView/ card  She will need ov f/u in 31-90 days per insurance regs

## 2020-11-21 NOTE — Telephone Encounter (Signed)
I have called the pt and LM on VM

## 2020-11-21 NOTE — Telephone Encounter (Signed)
Patient would like results of home sleep study. Patient phone number is [phone].

## 2020-11-21 NOTE — Telephone Encounter (Signed)
I have called the pt and she is aware of results per CY.  I have placed the order for her to be set up with cpap.  Nothing further is needed.

## 2020-11-21 NOTE — Telephone Encounter (Signed)
Lm for patient.  

## 2020-12-12 ENCOUNTER — Telehealth: Payer: Self-pay | Admitting: Internal Medicine

## 2020-12-12 DIAGNOSIS — G4733 Obstructive sleep apnea (adult) (pediatric): Secondary | ICD-10-CM

## 2020-12-12 NOTE — Telephone Encounter (Signed)
Patient is currently scheduled for a CPAP follow up tomorrow at 9am with Dr. Annamaria Boots. Per Maryfrances Bunnell, she received her cpap machine 4 days ago. Because of this, we will need to push out her appointment. I called her to inform her but she did not answer.   Left a message for her to give [place] a call back. Will leave this encounter open for follow up.

## 2020-12-13 ENCOUNTER — Ambulatory Visit: Payer: Managed Care, Other (non HMO) | Admitting: Internal Medicine

## 2020-12-13 NOTE — Telephone Encounter (Signed)
LMTCB   Please advise if okay to send order for Resmed Airfit S30. Thanks :)

## 2020-12-13 NOTE — Telephone Encounter (Signed)
Order placed.   LM informing patient order has been sent to [facility].   Nothing further needed at this time.

## 2020-12-13 NOTE — Telephone Encounter (Signed)
Ok - please order to her DME as requested

## 2021-01-15 NOTE — Progress Notes (Signed)
07/22/20- 40 yoF never smoker for sleep evaluation courtesy of Dr Carolann Littler with concern of fatigue and snoring Medical problem list includes HTN, IBS, GERD, Obesity, PCOS,  Epworth score- 0 Body weight today- 225 lbs Covid vax-2 Phizer, 1 Moderna -----Snore, witnessed apneas, fatigue  Always tired x 20 years. Told  by girlfriend that she snores and witnessed apnea. No ENT surgery or lung disease.Occ extra beats, otw no cardiac issues.  Adopted. No parasomnias. No sleep meds. 1 Pepsi/ day. "Anxious"- avoids naps and coffee.   01/16/21-41 year old female never smoker followed for OSA, complicated by HTN, IBS, GERD, Obesity, PCOS,  HST 10/17/20- AHI 72/ hr, desaturation to 81%, body weight 225 lbs CPAP auto 5-20/ [facility]   ordered 11/22/20 Download-compliance 100%, AHI 0.2/ hr Body weight today-226 lbs Covid vax-3 Phizer Flu vax-today She is very pleased with her CPAP experience so far.  Sleeping much better and feeling better during the day.  No concerns or obstacles identified.  Full facemask.  Download reviewed.  ROS-see HPI + = positive Constitutional:    weight loss, night sweats, fevers, chills, fatigue, lassitude. HEENT:    headaches, difficulty swallowing, tooth/dental problems, sore throat,       sneezing, itching, ear ache, nasal congestion, post nasal drip, snoring CV:    chest pain, orthopnea, PND, swelling in lower extremities, anasarca,                                   dizziness, +palpitations Resp:   shortness of breath with exertion or at rest.                productive cough,   non-productive cough, coughing up of blood.              change in color of mucus.  wheezing.   Skin:    rash or lesions. GI:+heartburn, indigestion, abdominal pain, nausea, vomiting, diarrhea,                 change in bowel habits, loss of appetite GU: dysuria, change in color of urine, no urgency or frequency.   flank pain. MS:   joint pain, stiffness, decreased range of motion, back  pain. Neuro-     nothing unusual Psych:  change in mood or affect.  depression or +anxiety.   memory loss.   OBJ- Physical Exam General- Alert, Oriented, Affect-appropriate, Distress- none acute, + obese Skin- rash-none, lesions- none, excoriation- none Lymphadenopathy- none Head- atraumatic            Eyes- Gross vision intact, PERRLA, conjunctivae and secretions clear            Ears- Hearing, canals-normal            Nose- Clear, no-Septal dev, mucus, polyps, erosion, perforation             Throat- Mallampati III, mucosa clear , drainage- none, tonsils+, +teeth Neck- flexible , trachea midline, no stridor , thyroid nl, carotid no bruit Chest - symmetrical excursion , unlabored           Heart/CV- RRR , no murmur , no gallop  , no rub, nl s1 s2                           - JVD- none , edema- none, stasis changes- none, varices- none  Lung- clear to P&A, wheeze- none, cough- none , dullness-none, rub- none           Chest wall-  Abd-  Br/ Gen/ Rectal- Not done, not indicated Extrem- cyanosis- none, clubbing, none, atrophy- none, strength- nl Neuro- grossly intact to observation

## 2021-01-16 ENCOUNTER — Other Ambulatory Visit: Payer: Self-pay

## 2021-01-16 ENCOUNTER — Ambulatory Visit (INDEPENDENT_AMBULATORY_CARE_PROVIDER_SITE_OTHER): Payer: Managed Care, Other (non HMO) | Admitting: Internal Medicine

## 2021-01-16 ENCOUNTER — Encounter: Payer: Self-pay | Admitting: Internal Medicine

## 2021-01-16 VITALS — BP 130/88 | HR 80 | Temp 98.0°F | Ht 65.0 in | Wt 226.8 lb

## 2021-01-16 DIAGNOSIS — Z23 Encounter for immunization: Secondary | ICD-10-CM

## 2021-01-16 DIAGNOSIS — G4733 Obstructive sleep apnea (adult) (pediatric): Secondary | ICD-10-CM | POA: Diagnosis not present

## 2021-01-16 DIAGNOSIS — E6609 Other obesity due to excess calories: Secondary | ICD-10-CM

## 2021-01-16 DIAGNOSIS — Z6835 Body mass index (BMI) 35.0-35.9, adult: Secondary | ICD-10-CM | POA: Diagnosis not present

## 2021-01-16 NOTE — Patient Instructions (Signed)
We can continue CPAP auto 5-20  Please call if we can help 

## 2021-01-16 NOTE — Assessment & Plan Note (Signed)
Weight loss will help management of her sleep apnea and is encouraged-diet and exercise.  Consider formal program.

## 2021-01-16 NOTE — Assessment & Plan Note (Signed)
Benefits from CPAP with improved sleep.  Good compliance and control. Plan-continue AutoPap 5-20

## 2021-05-14 NOTE — Progress Notes (Signed)
HPI female never smoker followed for OSA, complicated by HTN, IBS, GERD, Obesity, PCOS,  HST 10/17/20- AHI 72/ hr, desaturation to 81%, body weight 225 lbs  ============================================================    01/16/21-41 year old female never smoker followed for OSA, complicated by HTN, IBS, GERD, Obesity, PCOS,  HST 10/17/20- AHI 72/ hr, desaturation to 81%, body weight 225 lbs CPAP auto 5-20/ [facility]   ordered 11/22/20 Download-compliance 100%, AHI 0.2/ hr Body weight today-226 lbs Covid vax-3 Phizer Flu vax-today She is very pleased with her CPAP experience so far.  Sleeping much better and feeling better during the day.  No concerns or obstacles identified.  Full facemask.  Download reviewed.  05/15/21- 41 year old female never smoker followed for OSA, complicated by HTN, IBS, GERD, Obesity, PCOS,  CPAP  auto 5-20/ [facility]   ordered 11/22/20 Download-compliance 100%, AHI 0.3/ hr Body weight today-229 lbs Covid vax-4 Phizer Flu vax-had States he "loves" her CPAP machine and says it works very well-does not want to sleep without it.  No longer needing significant caffeine to stay awake.  ROS-see HPI + = positive Constitutional:    weight loss, night sweats, fevers, chills, fatigue, lassitude. HEENT:    headaches, difficulty swallowing, tooth/dental problems, sore throat,       sneezing, itching, ear ache, nasal congestion, post nasal drip, snoring CV:    chest pain, orthopnea, PND, swelling in lower extremities, anasarca,                                   dizziness, +palpitations Resp:   shortness of breath with exertion or at rest.                productive cough,   non-productive cough, coughing up of blood.              change in color of mucus.  wheezing.   Skin:    rash or lesions. GI:+heartburn, indigestion, abdominal pain, nausea, vomiting, diarrhea,                 change in bowel habits, loss of appetite GU: dysuria, change in color of urine, no urgency or frequency.    flank pain. MS:   joint pain, stiffness, decreased range of motion, back pain. Neuro-     nothing unusual Psych:  change in mood or affect.  depression or +anxiety.   memory loss.   OBJ- Physical Exam General- Alert, Oriented, Affect-appropriate, Distress- none acute, + obese Skin- rash-none, lesions- none, excoriation- none Lymphadenopathy- none Head- atraumatic            Eyes- Gross vision intact, PERRLA, conjunctivae and secretions clear            Ears- Hearing, canals-normal            Nose- Clear, no-Septal dev, mucus, polyps, erosion, perforation             Throat- Mallampati III, mucosa clear , drainage- none, tonsils+, +teeth Neck- flexible , trachea midline, no stridor , thyroid nl, carotid no bruit Chest - symmetrical excursion , unlabored           Heart/CV- RRR , no murmur , no gallop  , no rub, nl s1 s2                           - JVD- none , edema- none, stasis changes- none, varices- none  Lung- clear to P&A, wheeze- none, cough- none , dullness-none, rub- none           Chest wall-  Abd-  Br/ Gen/ Rectal- Not done, not indicated Extrem- cyanosis- none, clubbing, none, atrophy- none, strength- nl Neuro- grossly intact to observation

## 2021-05-15 ENCOUNTER — Encounter: Payer: Self-pay | Admitting: Internal Medicine

## 2021-05-15 ENCOUNTER — Ambulatory Visit: Payer: Managed Care, Other (non HMO) | Admitting: Internal Medicine

## 2021-05-15 DIAGNOSIS — G4733 Obstructive sleep apnea (adult) (pediatric): Secondary | ICD-10-CM | POA: Diagnosis not present

## 2021-05-15 DIAGNOSIS — E6609 Other obesity due to excess calories: Secondary | ICD-10-CM | POA: Diagnosis not present

## 2021-05-15 DIAGNOSIS — Z6835 Body mass index (BMI) 35.0-35.9, adult: Secondary | ICD-10-CM | POA: Diagnosis not present

## 2021-05-15 NOTE — Patient Instructions (Signed)
We can continue CPAP auto 5-20 ? ?Please call if we can help ? ?Glad you are doing well ! ?

## 2021-06-13 NOTE — Assessment & Plan Note (Signed)
No progress.  Continue efforts at diet and exercise with goal of normalizing weight

## 2021-06-13 NOTE — Assessment & Plan Note (Signed)
Benefits from CPAP with good compliance and control Plan- continue CPAP auto 5-20 

## 2021-07-25 ENCOUNTER — Other Ambulatory Visit: Payer: Self-pay | Admitting: Family Medicine

## 2021-07-25 ENCOUNTER — Other Ambulatory Visit (HOSPITAL_COMMUNITY): Payer: Self-pay

## 2021-07-25 ENCOUNTER — Ambulatory Visit (INDEPENDENT_AMBULATORY_CARE_PROVIDER_SITE_OTHER): Payer: Managed Care, Other (non HMO) | Admitting: Family Medicine

## 2021-07-25 VITALS — BP 130/82 | HR 73 | Temp 98.0°F | Ht 66.0 in | Wt 226.6 lb

## 2021-07-25 DIAGNOSIS — E559 Vitamin D deficiency, unspecified: Secondary | ICD-10-CM

## 2021-07-25 DIAGNOSIS — Z0000 Encounter for general adult medical examination without abnormal findings: Secondary | ICD-10-CM

## 2021-07-25 DIAGNOSIS — K582 Mixed irritable bowel syndrome: Secondary | ICD-10-CM | POA: Diagnosis not present

## 2021-07-25 DIAGNOSIS — F419 Anxiety disorder, unspecified: Secondary | ICD-10-CM

## 2021-07-25 DIAGNOSIS — E782 Mixed hyperlipidemia: Secondary | ICD-10-CM | POA: Diagnosis not present

## 2021-07-25 LAB — COMPREHENSIVE METABOLIC PANEL
ALT: 9 U/L (ref 0–35)
AST: 15 U/L (ref 0–37)
Albumin: 4.2 g/dL (ref 3.5–5.2)
Alkaline Phosphatase: 72 U/L (ref 39–117)
BUN: 12 mg/dL (ref 6–23)
CO2: 24 mEq/L (ref 19–32)
Calcium: 9.4 mg/dL (ref 8.4–10.5)
Chloride: 105 mEq/L (ref 96–112)
Creatinine, Ser: 1.04 mg/dL (ref 0.40–1.20)
GFR: 67.36 mL/min (ref >60.00)
Glucose, Bld: 92 mg/dL (ref 70–99)
Potassium: 4.2 mEq/L (ref 3.5–5.1)
Sodium: 138 mEq/L (ref 135–145)
Total Bilirubin: 0.4 mg/dL (ref 0.2–1.2)
Total Protein: 7.2 g/dL (ref 6.0–8.3)

## 2021-07-25 LAB — CBC WITH DIFFERENTIAL/PLATELET
Basophils Absolute: 0 10*3/uL (ref 0.0–0.1)
Basophils Relative: 0.6 % (ref 0.0–3.0)
Eosinophils Absolute: 0.1 10*3/uL (ref 0.0–0.7)
Eosinophils Relative: 0.7 % (ref 0.0–5.0)
HCT: 42.4 % (ref 36.0–46.0)
Hemoglobin: 13.9 g/dL (ref 12.0–15.0)
Lymphocytes Relative: 32.8 % (ref 12.0–46.0)
Lymphs Abs: 2.5 10*3/uL (ref 0.7–4.0)
MCHC: 32.8 g/dL (ref 30.0–36.0)
MCV: 78 fl (ref 78.0–100.0)
Monocytes Absolute: 0.5 10*3/uL (ref 0.1–1.0)
Monocytes Relative: 6.4 % (ref 3.0–12.0)
Neutro Abs: 4.6 10*3/uL (ref 1.4–7.7)
Neutrophils Relative %: 59.5 % (ref 43.0–77.0)
Platelets: 181 10*3/uL (ref 150.0–400.0)
RBC: 5.44 Mil/uL — ABNORMAL HIGH (ref 3.87–5.11)
RDW: 14.5 % (ref 11.5–15.5)
WBC: 7.7 10*3/uL (ref 4.0–10.5)

## 2021-07-25 LAB — LIPID PANEL
Cholesterol: 204 mg/dL — ABNORMAL HIGH (ref 0–200)
HDL: 41.8 mg/dL (ref >39.00)
NonHDL: 162.34
Total CHOL/HDL Ratio: 5
Triglycerides: 233 mg/dL — ABNORMAL HIGH (ref 0.0–149.0)
VLDL: 46.6 mg/dL — ABNORMAL HIGH (ref 0.0–40.0)

## 2021-07-25 LAB — VITAMIN B12: Vitamin B-12: 161 pg/mL — ABNORMAL LOW (ref 211–911)

## 2021-07-25 LAB — VITAMIN D 25 HYDROXY (VIT D DEFICIENCY, FRACTURES): VITD: 24.47 ng/mL — ABNORMAL LOW (ref 30.00–100.00)

## 2021-07-25 LAB — TSH: TSH: 3.09 u[IU]/mL (ref 0.35–5.50)

## 2021-07-25 LAB — HEMOGLOBIN A1C: Hgb A1c MFr Bld: 5.8 % (ref 4.6–6.5)

## 2021-07-25 LAB — LDL CHOLESTEROL, DIRECT: Direct LDL: 137 mg/dL

## 2021-07-25 LAB — T4, FREE: Free T4: 0.65 ng/dL (ref 0.60–1.60)

## 2021-07-25 MED ORDER — ESCITALOPRAM OXALATE 5 MG PO TABS: 5.0000 mg | ORAL_TABLET | Freq: Every day | ORAL | 3 refills | Status: DC

## 2021-07-25 MED ORDER — VITAMIN D (ERGOCALCIFEROL) 1.25 MG (50000 UNIT) PO CAPS
50000.0000 [IU] | ORAL_CAPSULE | ORAL | 0 refills | Status: DC
  Filled 2021-07-25: qty 4, 28d supply, fill #0
  Filled 2021-08-23: qty 4, 28d supply, fill #1
  Filled 2021-09-22: qty 4, 28d supply, fill #2

## 2021-07-25 NOTE — Patient Instructions (Addendum)
In the past (2014) you tried Bentyl 20 mg and later 10 mg 4 times a day.  Rifaximin was the other medication we were speaking about however I do not see that one in the chart.  Tried taking half Imodium tab to see if you notice improvement in symptoms.  A referral to gastroenterology was placed for you.  You should expect a phone call about scheduling this appointment.

## 2021-07-25 NOTE — Progress Notes (Signed)
Subjective:     Kristin [name] is a 41 y.o. female and is here for a comprehensive physical exam. The patient reports increased mixed IBS symptoms such as cramping.  Having more diarrhea especially with dairy.  Also notes increased urgency.  Tried Bentyl and rifaximin in the past.  Increased IBS symptoms with flying.  Will eat just crackers prior to a trip.  A whole tab of Imodium typically causes constipation.  Requesting refill on Lexapro 5 mg.  Pap sent with OB/GYN, Dr. Philis Pique last week at [facility].  Patient has mammogram scheduled next Friday.  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   Social Determinants of Radio broadcast assistant Strain: Not on file  Food Insecurity: Not on file  Transportation Needs: Not on file  Physical Activity: Not on file  Stress: Not on file  Social Connections: Not on file  Intimate Partner Violence: Not on file   Health Maintenance  Topic Date Due   PAP SMEAR-Modifier  01/02/2019   INFLUENZA VACCINE  08/12/2021   TETANUS/TDAP  07/10/2028   COVID-19 Vaccine  Completed   Hepatitis C Screening  Completed   HIV Screening  Completed   HPV VACCINES  Aged Out    The following portions of the patient's history were reviewed and updated as appropriate: allergies, current medications, past family history, past medical history, past social history, past surgical history, and problem list.  Review of  Systems Pertinent items noted in HPI and remainder of comprehensive ROS otherwise negative.   Objective:    BP 130/82 (BP Location: Right Arm, Patient Position: Sitting, Cuff Size: Large)   Pulse 73   Temp 98 F (36.7 C) (Oral)   Ht '5\' 6"'$  (1.676 m)   Wt 226 lb 9.6 oz (102.8 kg)   LMP 06/19/2021   SpO2 97%   BMI 36.57 kg/m  General appearance: alert, cooperative, and no distress Head: Normocephalic, without obvious abnormality, atraumatic Eyes: conjunctivae/corneas clear. PERRL, EOM's intact. Fundi benign. Ears: normal TM's and external ear canals both ears Nose: Nares normal. Septum midline. Mucosa normal. No drainage or sinus tenderness. Throat: lips, mucosa, and tongue normal; teeth and gums normal Neck: no adenopathy, no carotid bruit, no JVD, supple, symmetrical, trachea midline, and thyroid not enlarged, symmetric, no tenderness/mass/nodules Lungs: clear to auscultation bilaterally Heart: regular rate and rhythm, S1, S2 normal, no murmur, click, rub or gallop Abdomen: soft, non-tender; bowel sounds normal; no masses,  no organomegaly Extremities: extremities normal, atraumatic, no cyanosis or edema Pulses: 2+ and symmetric Skin: Skin color, texture, turgor normal. No rashes or lesions Lymph nodes: Cervical, supraclavicular, and axillary nodes normal. Neurologic: Alert and oriented X 3, normal strength and tone. Normal symmetric reflexes. Normal coordination and gait      07/25/2021    8:40 AM  07/22/2020    9:38 AM 07/10/2020    8:18 AM 07/06/2018    8:39 AM  GAD 7 : Generalized Anxiety Score  Nervous, Anxious, on Edge 0 0 0 0  Control/stop worrying 0 0 0 0  Worry too much - different things 0 0 0 0  Trouble relaxing 1 0 0 0  Restless 0 0 0 0  Easily annoyed or irritable 0 0 1 0  Afraid - awful might happen 0 0 0 0  Total GAD 7 Score 1 0 1 0  Anxiety Difficulty Not difficult at all          07/25/2021    8:38 AM 07/22/2020    9:38 AM 07/10/2020    8:18 AM   Depression screen PHQ 2/9  Decreased Interest 0 0 0  Down, Depressed, Hopeless 0 0 0  PHQ - 2 Score 0 0 0  Altered sleeping 1 0 0  Tired, decreased energy 1 0 0  Change in appetite 0 0 0  Feeling bad or failure about yourself  0 0 0  Trouble concentrating 0 0 0  Moving slowly or fidgety/restless 0 0 0  Suicidal thoughts 0 0 0  PHQ-9 Score 2 0 0  Difficult doing work/chores Not difficult at all        Assessment:    Healthy female exam with increased IBS symptoms.     Plan:    Anticipatory guidance given including wearing seatbelts, smoke detectors in the home, increasing physical activity, increasing p.o. intake of water and vegetables. -labs -Mammogram scheduled next Friday -Pap done last week with OB/GYN, Dr. Philis Pique at [place] reviewed.  Patient had fourth dose of COVID-19 vaccine February 2023. -Given handout -Next CPE in 1 year See After Visit Summary for Counseling Recommendations   - Plan: Hemoglobin A1c  Anxiety -Stable -GAD-7 score 1 -PHQ-9 score 2 -Continue self-care and Lexapro 5 mg daily  - Plan: escitalopram (LEXAPRO) 5 MG tablet, TSH, T4, Free  Irritable bowel syndrome with both constipation and diarrhea -Given increased symptoms discussed follow-up with GI.  Referral placed. -Low FODMAP diet -Consider trying half tab of Imodium as whole tab causes constipation  - Plan: CMP, Vitamin B12, Vitamin D, 25-hydroxy, CBC with Differential/Platelet, TSH, T4, Free, Ambulatory referral to Gastroenterology  Mixed hyperlipidemia -Total cholesterol 213, HDL 41.7, LDL 137, triglycerides 223 on 07/22/2020. -Lifestyle modifications -For continued elevation in cholesterol consider fish oil tablets or other medication  - Plan: Lipid panel  Follow-up in 1 month as needed  Grier Mitts, MD

## 2021-08-01 ENCOUNTER — Ambulatory Visit: Payer: Managed Care, Other (non HMO)

## 2021-08-01 ENCOUNTER — Ambulatory Visit (INDEPENDENT_AMBULATORY_CARE_PROVIDER_SITE_OTHER): Payer: Managed Care, Other (non HMO) | Admitting: *Deleted

## 2021-08-01 DIAGNOSIS — E538 Deficiency of other specified B group vitamins: Secondary | ICD-10-CM | POA: Diagnosis not present

## 2021-08-01 MED ORDER — CYANOCOBALAMIN 1000 MCG/ML IJ SOLN
1000.0000 ug | Freq: Once | INTRAMUSCULAR | Status: AC
  Administered 2021-08-01: 1000 ug via INTRAMUSCULAR

## 2021-08-01 NOTE — Progress Notes (Signed)
Per orders of Dr. Jordan, injection of Cyanocobalamin 1000mcg given by Tyaira Heward A. Patient tolerated injection well.  

## 2021-08-06 ENCOUNTER — Encounter: Payer: Self-pay | Admitting: Gastroenterology

## 2021-08-08 ENCOUNTER — Encounter: Payer: Self-pay | Admitting: Family Medicine

## 2021-08-25 ENCOUNTER — Other Ambulatory Visit (HOSPITAL_COMMUNITY): Payer: Self-pay

## 2021-09-05 ENCOUNTER — Other Ambulatory Visit (HOSPITAL_COMMUNITY): Payer: Self-pay

## 2021-09-05 ENCOUNTER — Other Ambulatory Visit (INDEPENDENT_AMBULATORY_CARE_PROVIDER_SITE_OTHER): Payer: Managed Care, Other (non HMO)

## 2021-09-05 ENCOUNTER — Encounter: Payer: Self-pay | Admitting: Gastroenterology

## 2021-09-05 ENCOUNTER — Ambulatory Visit: Payer: Managed Care, Other (non HMO) | Admitting: Gastroenterology

## 2021-09-05 VITALS — BP 132/84 | HR 76 | Ht 66.0 in | Wt 223.5 lb

## 2021-09-05 DIAGNOSIS — R197 Diarrhea, unspecified: Secondary | ICD-10-CM

## 2021-09-05 DIAGNOSIS — R1084 Generalized abdominal pain: Secondary | ICD-10-CM

## 2021-09-05 LAB — SEDIMENTATION RATE: Sed Rate: 10 mm/hr (ref 0–20)

## 2021-09-05 LAB — TSH: TSH: 1.7 u[IU]/mL (ref 0.35–5.50)

## 2021-09-05 MED ORDER — NA SULFATE-K SULFATE-MG SULF 17.5-3.13-1.6 GM/177ML PO SOLN
1.0000 | Freq: Once | ORAL | 0 refills | Status: AC
  Filled 2021-09-05: qty 354, 1d supply, fill #0

## 2021-09-05 MED ORDER — HYOSCYAMINE SULFATE 0.125 MG SL SUBL
0.1250 mg | SUBLINGUAL_TABLET | Freq: Four times a day (QID) | SUBLINGUAL | 1 refills | Status: AC | PRN
Start: 2021-09-05 — End: ?
  Filled 2021-09-05: qty 15, 4d supply, fill #0

## 2021-09-05 NOTE — Progress Notes (Signed)
I agree with the assessment and plan as outlined by Ms. Zehr. 

## 2021-09-05 NOTE — Patient Instructions (Signed)
Your provider has requested that you go to the basement level for lab work before leaving today. Press "B" on the elevator. The lab is located at the first door on the left as you exit the elevator.  We have sent the following medications to your pharmacy for you to pick up at your convenience: Levsin 0.125 mg every 6 hours as needed.   You have been scheduled for a colonoscopy. Please follow written instructions given to you at your visit today.  Please pick up your prep supplies at the pharmacy within the next 1-3 days. If you use inhalers (even only as needed), please bring them with you on the day of your procedure.  _______________________________________________________  If you are age 79 or older, your body mass index should be between 23-30. Your Body mass index is 36.07 kg/m. If this is out of the aforementioned range listed, please consider follow up with your Primary Care Provider.  If you are age 33 or younger, your body mass index should be between 19-25. Your Body mass index is 36.07 kg/m. If this is out of the aformentioned range listed, please consider follow up with your Primary Care Provider.   ________________________________________________________  The  GI providers would like to encourage you to use [facility] to communicate with providers for non-urgent requests or questions.  Due to long hold times on the telephone, sending your provider a message by [facility] may be a faster and more efficient way to get a response.  Please allow 48 business hours for a response.  Please remember that this is for non-urgent requests.  _______________________________________________________

## 2021-09-05 NOTE — Progress Notes (Signed)
09/05/2021 Kristin [name] [id number] 23-Mar-1980   HISTORY OF PRESENT ILLNESS: This is a 41 year old female who is new to our office.  She has been referred here by her PCP, Dr. Volanda Napoleon, for evaluation regarding IBS type symptoms.  The patient tells me that she travels a lot for work and oftentimes does not eat the best diet.  She says that she gets a lot of urgency after eating with diarrhea and a lot of abdominal pain/cramping.  She has to plan carefully what she eats and when she eats.  She says that over the past several months it is gotten much worse.  She has occasional solid stools, but even has abdominal cramping when she has a seminormal bowel movement.  No previous GI procedures.  CBC, CMP, TSH unremarkable.   Past Medical History:  Diagnosis Date   Anxiety    Dysrhythmia    hx pvc's none recwent per pt   Genital warts    GERD (gastroesophageal reflux disease)    Hyperlipidemia    Hypertension    situational no bp meds ever   IBS (irritable bowel syndrome)    diarrhea prone   PONV (postoperative nausea and vomiting)    after appendix   Past Surgical History:  Procedure Laterality Date   APPENDECTOMY     DILATATION & CURETTAGE/HYSTEROSCOPY WITH MYOSURE N/A 12/21/2018   Procedure: DILATATION & CURETTAGE/HYSTEROSCOPY WITH MYOSURE;  Surgeon: Bobbye Charleston, MD;  Location: [facility];  Service: Gynecology;  Laterality: N/A;   LAPAROSCOPIC APPENDECTOMY  10/04/2011   Procedure: APPENDECTOMY LAPAROSCOPIC;  Surgeon: Stark Klein, MD;  Location: [facility];  Service: General;  Laterality: N/A;   WISDOM TOOTH EXTRACTION      reports that she has never smoked. She has never used smokeless tobacco. She reports that she does not drink alcohol and does not use drugs. family history includes Diabetes in her mother; Kidney disease in her mother. She was adopted. No Active Allergies    Outpatient Encounter Medications as of 09/05/2021  Medication Sig   cyanocobalamin  (VITAMIN B12) 1000 MCG/ML injection Inject 1,000 mcg into the muscle every 30 (thirty) days.   escitalopram (LEXAPRO) 5 MG tablet Take 1 tablet (5 mg total) by mouth daily.   Vitamin D, Ergocalciferol, (DRISDOL) 1.25 MG (50000 UNIT) CAPS capsule Take 1 capsule (50,000 Units total) by mouth every 7 (seven) days.   [DISCONTINUED] Norethindrone-Ethinyl Estradiol-Fe Biphas (LO LOESTRIN FE) 1 MG-10 MCG / 10 MCG tablet Take 1 tablet by mouth daily.   No facility-administered encounter medications on file as of 09/05/2021.     REVIEW OF SYSTEMS  : All other systems reviewed and negative except where noted in the History of Present Illness.   PHYSICAL EXAM: BP 132/84   Pulse 76   Ht '5\' 6"'$  (1.676 m)   Wt 223 lb 8 oz (101.4 kg)   SpO2 99%   BMI 36.07 kg/m  General: Well developed white female in no acute distress Head: Normocephalic and atraumatic Eyes:  Sclerae anicteric, conjunctiva pink. Ears: Normal auditory acuity Lungs: Clear throughout to auscultation; no W/R/R. Heart: Regular rate and rhythm; no M/R/G. Abdomen: Soft, non-distended.  BS present.  Non-tender. Rectal:  Will be done at the time of colonoscopy. Musculoskeletal: Symmetrical with no gross deformities  Skin: No lesions on visible extremities Extremities: No edema  Neurological: Alert oriented x 4, grossly non-focal Psychological:  Alert and cooperative. Normal mood and affect  ASSESSMENT AND PLAN: *41 year old female with complaints of  diarrhea and generalized abdominal pain.  This has been occurring after eating for years, worse over the last several months.  Presumed to have IBS.  TSH is fine.  We will check celiac labs, sed rate, CRP.  We will plan for colonoscopy to rule out IBD.  He is being scheduled Dr. Lorenso Courier.  The risks, benefits, and alternatives to colonoscopy were discussed with the patient and she consents to proceed.  CC:  Kristin Ruddy, MD

## 2021-09-06 LAB — IGA: Immunoglobulin A: 121 mg/dL (ref 47–310)

## 2021-09-06 LAB — TISSUE TRANSGLUTAMINASE ABS,IGG,IGA
(tTG) Ab, IgA: <1 U/mL
(tTG) Ab, IgG: <1 U/mL

## 2021-09-08 ENCOUNTER — Ambulatory Visit (INDEPENDENT_AMBULATORY_CARE_PROVIDER_SITE_OTHER): Payer: Managed Care, Other (non HMO)

## 2021-09-08 DIAGNOSIS — E538 Deficiency of other specified B group vitamins: Secondary | ICD-10-CM | POA: Diagnosis not present

## 2021-09-08 MED ORDER — CYANOCOBALAMIN 1000 MCG/ML IJ SOLN
1000.0000 ug | Freq: Once | INTRAMUSCULAR | Status: AC
  Administered 2021-09-08: 1000 ug via INTRAMUSCULAR

## 2021-09-08 NOTE — Progress Notes (Signed)
Pt here for monthly B12 injection per Dr Banks.  B12 1000mcg given IM, and pt tolerated injection well.    

## 2021-09-16 ENCOUNTER — Other Ambulatory Visit (HOSPITAL_COMMUNITY): Payer: Self-pay

## 2021-09-18 ENCOUNTER — Encounter: Payer: Self-pay | Admitting: Internal Medicine

## 2021-09-18 ENCOUNTER — Ambulatory Visit (AMBULATORY_SURGERY_CENTER): Payer: Managed Care, Other (non HMO) | Admitting: Internal Medicine

## 2021-09-18 VITALS — BP 144/89 | HR 77 | Temp 98.0°F | Resp 13 | Ht 66.0 in | Wt 233.0 lb

## 2021-09-18 DIAGNOSIS — K649 Unspecified hemorrhoids: Secondary | ICD-10-CM | POA: Diagnosis not present

## 2021-09-18 DIAGNOSIS — R197 Diarrhea, unspecified: Secondary | ICD-10-CM

## 2021-09-18 DIAGNOSIS — D123 Benign neoplasm of transverse colon: Secondary | ICD-10-CM

## 2021-09-18 DIAGNOSIS — K6389 Other specified diseases of intestine: Secondary | ICD-10-CM | POA: Diagnosis not present

## 2021-09-18 DIAGNOSIS — K635 Polyp of colon: Secondary | ICD-10-CM

## 2021-09-18 MED ORDER — SODIUM CHLORIDE 0.9 % IV SOLN: 500.0000 mL | Freq: Once | INTRAVENOUS | Status: DC

## 2021-09-18 NOTE — Progress Notes (Signed)
Called to room to assist during endoscopic procedure.  Patient ID and intended procedure confirmed with present staff. Received instructions for my participation in the procedure from the performing physician.  

## 2021-09-18 NOTE — Progress Notes (Signed)
GASTROENTEROLOGY PROCEDURE H&P NOTE   Primary Care Physician: [name], MD    Reason for Procedure:   Diarrhea, generalized ab pain  Plan:    Colonoscopy  Patient is appropriate for endoscopic procedure(s) in the ambulatory ([place]) setting.  The nature of the procedure, as well as the risks, benefits, and alternatives were carefully and thoroughly reviewed with the patient. Ample time for discussion and questions allowed. The patient understood, was satisfied, and agreed to proceed.     HPI: Kristin [name] is a 41 y.o. female who presents for colonoscopy for evaluation of diarrhea and generalized ab pain.  Patient was most recently seen in the Gastroenterology Clinic on 09/05/21.  No interval change in medical history since that appointment. Please refer to that note for full details regarding GI history and clinical presentation.   Past Medical History:  Diagnosis Date   Anxiety    Dysrhythmia    hx pvc's none recwent per pt   Genital warts    GERD (gastroesophageal reflux disease)    Hyperlipidemia    Hypertension    situational no bp meds ever   IBS (irritable bowel syndrome)    diarrhea prone   PONV (postoperative nausea and vomiting)    after appendix    Past Surgical History:  Procedure Laterality Date   APPENDECTOMY     DILATATION & CURETTAGE/HYSTEROSCOPY WITH MYOSURE N/A 12/21/2018   Procedure: DILATATION & CURETTAGE/HYSTEROSCOPY WITH MYOSURE;  Surgeon: Bobbye Charleston, MD;  Location: [facility];  Service: Gynecology;  Laterality: N/A;   LAPAROSCOPIC APPENDECTOMY  10/04/2011   Procedure: APPENDECTOMY LAPAROSCOPIC;  Surgeon: Stark Klein, MD;  Location: [facility];  Service: General;  Laterality: N/A;   WISDOM TOOTH EXTRACTION      Prior to Admission medications   Medication Sig Start Date End Date Taking? Authorizing Provider  cyanocobalamin (VITAMIN B12) 1000 MCG/ML injection Inject 1,000 mcg into the muscle every 30 (thirty) days.   Yes  [provider]  escitalopram (LEXAPRO) 5 MG tablet Take 1 tablet (5 mg total) by mouth daily. 07/25/21  Yes [name], MD  Vitamin D, Ergocalciferol, (DRISDOL) 1.25 MG (50000 UNIT) CAPS capsule Take 1 capsule (50,000 Units total) by mouth every 7 (seven) days. 07/25/21  Yes [name], MD  hyoscyamine (LEVSIN SL) 0.125 MG SL tablet Place 1 tablet (0.125 mg total) under the tongue every 6 (six) hours as needed. Patient not taking: Reported on 09/18/2021 09/05/21   Zehr, Laban Emperor, PA-C    Current Outpatient Medications  Medication Sig Dispense Refill   cyanocobalamin (VITAMIN B12) 1000 MCG/ML injection Inject 1,000 mcg into the muscle every 30 (thirty) days.     escitalopram (LEXAPRO) 5 MG tablet Take 1 tablet (5 mg total) by mouth daily. 90 tablet 3   Vitamin D, Ergocalciferol, (DRISDOL) 1.25 MG (50000 UNIT) CAPS capsule Take 1 capsule (50,000 Units total) by mouth every 7 (seven) days. 12 capsule 0   hyoscyamine (LEVSIN SL) 0.125 MG SL tablet Place 1 tablet (0.125 mg total) under the tongue every 6 (six) hours as needed. (Patient not taking: Reported on 09/18/2021) 15 tablet 1   Current Facility-Administered Medications  Medication Dose Route Frequency Provider Last Rate Last Admin   0.9 %  sodium chloride infusion  500 mL Intravenous Once Sharyn Creamer, MD        Allergies as of 09/18/2021   (No Active Allergies)    Family History  Adopted: Yes  Problem Relation Age of  Onset   Diabetes Mother    Kidney disease Mother    Colon cancer Neg Hx    Stomach cancer Neg Hx    Esophageal cancer Neg Hx    Rectal cancer Neg Hx     Social History   Socioeconomic History   Marital status: Single    Spouse name: Not on file   Number of children: Not on file   Years of education: Not on file   Highest education level: Not on file  Occupational History   Occupation: [employer]  Tobacco Use   Smoking status: Never   Smokeless tobacco: Never  Vaping Use   Vaping Use: Never  used  Substance and Sexual Activity   Alcohol use: No   Drug use: No   Sexual activity: Never  Other Topics Concern   Not on file  Social History Narrative   Female partner   no kids   From [place] originally   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   Social Determinants of Radio broadcast assistant Strain: Not on Art therapist Insecurity: Not on file  Transportation Needs: Not on file  Physical Activity: Not on file  Stress: Not on file  Social Connections: Not on file  Intimate Partner Violence: Not on file    Physical Exam: Vital signs in last 24 hours: BP 128/74 (BP Location: Right Arm, Patient Position: Sitting, Cuff Size: Normal)   Pulse 70   Temp 98 F (36.7 C) (Temporal)   Ht '5\' 6"'$  (1.676 m)   Wt 233 lb (105.7 kg)   SpO2 97%   BMI 37.61 kg/m  GEN: NAD EYE: Sclerae anicteric ENT: MMM CV: Non-tachycardic Pulm: No increased WOB GI: Soft NEURO:  Alert & Oriented   [name], MD [facility]   09/18/2021 10:52 AM

## 2021-09-18 NOTE — Op Note (Signed)
[facility] Patient Name: Kristin [name] Procedure Date: 09/18/2021 10:45 AM MRN: [id number] Endoscopist: Sonny Masters "Kristin [name] ,  Age: 41 Referring MD:  Date of Birth: 08-18-1980 Gender: Female Account #: [id number] Procedure:                Colonoscopy Indications:              Diarrhea Medicines:                Monitored Anesthesia Care Procedure:                Pre-Anesthesia Assessment:                           - Prior to the procedure, a History and Physical                            was performed, and patient medications and                            allergies were reviewed. The patient's tolerance of                            previous anesthesia was also reviewed. The risks                            and benefits of the procedure and the sedation                            options and risks were discussed with the patient.                            All questions were answered, and informed consent                            was obtained. Prior Anticoagulants: The patient has                            taken no previous anticoagulant or antiplatelet                            agents. ASA Grade Assessment: II - A patient with                            mild systemic disease. After reviewing the risks                            and benefits, the patient was deemed in                            satisfactory condition to undergo the procedure.                           After obtaining informed consent, the colonoscope  was passed under direct vision. Throughout the                            procedure, the patient's blood pressure, pulse, and                            oxygen saturations were monitored continuously. The                            Olympus PCF-[id number] (#[id number]) Colonoscope was                            introduced through the anus and advanced to the the                            terminal ileum. The colonoscopy was performed                             without difficulty. The patient tolerated the                            procedure well. The quality of the bowel                            preparation was good. The terminal ileum, ileocecal                            valve, appendiceal orifice, and rectum were                            photographed. Scope In: 11:22:29 AM Scope Out: 11:34:53 AM Scope Withdrawal Time: 0 hours 10 minutes 41 seconds  Total Procedure Duration: 0 hours 12 minutes 24 seconds  Findings:                 The terminal ileum appeared normal.                           A 3 mm polyp was found in the transverse colon. The                            polyp was sessile. The polyp was removed with a                            cold snare. Resection and retrieval were complete.                           Biopsies were taken with a cold forceps in the                            entire colon for histology.                           Non-bleeding internal hemorrhoids were found during  retroflexion. Complications:            No immediate complications. Estimated Blood Loss:     Estimated blood loss was minimal. Impression:               - The examined portion of the ileum was normal.                           - One 3 mm polyp in the transverse colon, removed                            with a cold snare. Resected and retrieved.                           - Non-bleeding internal hemorrhoids.                           - Biopsies were taken with a cold forceps for                            histology in the entire colon. Recommendation:           - Discharge patient to home (with escort).                           - Await pathology results.                           - The findings and recommendations were discussed                            with the patient.                           - Return to GI clinic in 6 weeks. Dr Georgian Co "Kristin [name]" Lorenso Courier,  09/18/2021 11:37:59 AM

## 2021-09-18 NOTE — Patient Instructions (Signed)
Await pathology results.  Handout on polyps given to you today.  Resume previous diet and medications.  Return to GI clinic in 6 weeks.   YOU HAD AN ENDOSCOPIC PROCEDURE TODAY AT [facility]:   Refer to the procedure report that was given to you for any specific questions about what was found during the examination.  If the procedure report does not answer your questions, please call your gastroenterologist to clarify.  If you requested that your care partner not be given the details of your procedure findings, then the procedure report has been included in a sealed envelope for you to review at your convenience later.  YOU SHOULD EXPECT: Some feelings of bloating in the abdomen. Passage of more gas than usual.  Walking can help get rid of the air that was put into your GI tract during the procedure and reduce the bloating. If you had a lower endoscopy (such as a colonoscopy or flexible sigmoidoscopy) you may notice spotting of blood in your stool or on the toilet paper. If you underwent a bowel prep for your procedure, you may not have a normal bowel movement for a few days.  Please Note:  You might notice some irritation and congestion in your nose or some drainage.  This is from the oxygen used during your procedure.  There is no need for concern and it should clear up in a day or so.  SYMPTOMS TO REPORT IMMEDIATELY:  Following lower endoscopy (colonoscopy or flexible sigmoidoscopy):  Excessive amounts of blood in the stool  Significant tenderness or worsening of abdominal pains  Swelling of the abdomen that is new, acute  Fever of 100F or higher   For urgent or emergent issues, a gastroenterologist can be reached at any hour by calling [phone]. Do not use MyChart messaging for urgent concerns.    DIET:  We do recommend a small meal at first, but then you may proceed to your regular diet.  Drink plenty of fluids but you should avoid alcoholic beverages for 24  hours.  ACTIVITY:  You should plan to take it easy for the rest of today and you should NOT DRIVE or use heavy machinery until tomorrow (because of the sedation medicines used during the test).    FOLLOW UP: Our staff will call the number listed on your records the next business day following your procedure.  We will call around 7:15- 8:00 am to check on you and address any questions or concerns that you may have regarding the information given to you following your procedure. If we do not reach you, we will leave a message.  If you develop any symptoms (ie: fever, flu-like symptoms, shortness of breath, cough etc.) before then, please call [phone].  If you test positive for Covid 19 in the 2 weeks post procedure, please call and report this information to [place].    If any biopsies were taken you will be contacted by phone or by letter within the next 1-3 weeks.  Please call us at [phone] if you have not heard about the biopsies in 3 weeks.    SIGNATURES/CONFIDENTIALITY: You and/or your care partner have signed paperwork which will be entered into your electronic medical record.  These signatures attest to the fact that that the information above on your After Visit Summary has been reviewed and is understood.  Full responsibility of the confidentiality of this discharge information lies with you and/or your care-partner.

## 2021-09-18 NOTE — Progress Notes (Signed)
PT taken to PACU. Monitors in place. VSS. Report given to RN. 

## 2021-09-18 NOTE — Progress Notes (Signed)
Vitals-DT  Pt's states no medical or surgical changes since previsit or office visit.  

## 2021-09-19 ENCOUNTER — Telehealth: Payer: Self-pay

## 2021-09-19 NOTE — Telephone Encounter (Signed)
Attempted to reach patient for post-procedure f/u call. No answer. Left message for her to please not hesitate to call us if she has any questions/concerns regarding her care. 

## 2021-09-22 ENCOUNTER — Encounter: Payer: Self-pay | Admitting: Internal Medicine

## 2021-09-22 ENCOUNTER — Other Ambulatory Visit (HOSPITAL_COMMUNITY): Payer: Self-pay

## 2021-10-09 ENCOUNTER — Ambulatory Visit (INDEPENDENT_AMBULATORY_CARE_PROVIDER_SITE_OTHER): Payer: Managed Care, Other (non HMO)

## 2021-10-09 DIAGNOSIS — E538 Deficiency of other specified B group vitamins: Secondary | ICD-10-CM

## 2021-10-09 MED ORDER — CYANOCOBALAMIN 1000 MCG/ML IJ SOLN
1000.0000 ug | Freq: Once | INTRAMUSCULAR | Status: AC
  Administered 2021-10-09: 1000 ug via INTRAMUSCULAR

## 2021-10-09 NOTE — Progress Notes (Signed)
Pt here for monthly B12 injection per Dr Banks.  B12 1000mcg given IM, and pt tolerated injection well.    

## 2021-12-11 ENCOUNTER — Other Ambulatory Visit: Payer: Self-pay

## 2021-12-11 ENCOUNTER — Other Ambulatory Visit (INDEPENDENT_AMBULATORY_CARE_PROVIDER_SITE_OTHER): Payer: Managed Care, Other (non HMO)

## 2021-12-11 DIAGNOSIS — E782 Mixed hyperlipidemia: Secondary | ICD-10-CM | POA: Diagnosis not present

## 2021-12-11 DIAGNOSIS — E538 Deficiency of other specified B group vitamins: Secondary | ICD-10-CM | POA: Diagnosis not present

## 2021-12-11 LAB — LIPID PANEL
Cholesterol: 199 mg/dL (ref 0–200)
HDL: 40.9 mg/dL (ref >39.00)
NonHDL: 158.53
Total CHOL/HDL Ratio: 5
Triglycerides: 233 mg/dL — ABNORMAL HIGH (ref 0.0–149.0)
VLDL: 46.6 mg/dL — ABNORMAL HIGH (ref 0.0–40.0)

## 2021-12-11 LAB — LDL CHOLESTEROL, DIRECT: Direct LDL: 130 mg/dL

## 2021-12-11 LAB — VITAMIN B12: Vitamin B-12: 337 pg/mL (ref 211–911)

## 2022-05-11 NOTE — Progress Notes (Deleted)
HPI female never smoker followed for OSA, complicated by HTN, IBS, GERD, Obesity, PCOS,  HST 10/17/20- AHI 72/ hr, desaturation to 81%, body weight 225 lbs  ============================================================   05/15/21- 42 year old female never smoker followed for OSA, complicated by HTN, IBS, GERD, Obesity, PCOS,  CPAP  auto 5-20/ [employer]   ordered 11/22/20 Download-compliance 100%, AHI 0.3/ hr Body weight today-229 lbs Covid vax-4 Phizer Flu vax-had States he "loves" her CPAP machine and says it works very well-does not want to sleep without it.  No longer needing significant caffeine to stay awake.  05/14/22- 21year-old female never smoker followed for OSA, complicated by HTN, IBS, GERD, Obesity, PCOS,  CPAP  auto 5-20/ [employer]   ordered 11/22/20 Download-compliance Body weight today-     ROS-see HPI + = positive Constitutional:    weight loss, night sweats, fevers, chills, fatigue, lassitude. HEENT:    headaches, difficulty swallowing, tooth/dental problems, sore throat,       sneezing, itching, ear ache, nasal congestion, post nasal drip, snoring CV:    chest pain, orthopnea, PND, swelling in lower extremities, anasarca,                                   dizziness, +palpitations Resp:   shortness of breath with exertion or at rest.                productive cough,   non-productive cough, coughing up of blood.              change in color of mucus.  wheezing.   Skin:    rash or lesions. GI:+heartburn, indigestion, abdominal pain, nausea, vomiting, diarrhea,                 change in bowel habits, loss of appetite GU: dysuria, change in color of urine, no urgency or frequency.   flank pain. MS:   joint pain, stiffness, decreased range of motion, back pain. Neuro-     nothing unusual Psych:  change in mood or affect.  depression or +anxiety.   memory loss.   OBJ- Physical Exam General- Alert, Oriented, Affect-appropriate, Distress- none acute, + obese Skin- rash-none,  lesions- none, excoriation- none Lymphadenopathy- none Head- atraumatic            Eyes- Gross vision intact, PERRLA, conjunctivae and secretions clear            Ears- Hearing, canals-normal            Nose- Clear, no-Septal dev, mucus, polyps, erosion, perforation             Throat- Mallampati III, mucosa clear , drainage- none, tonsils+, +teeth Neck- flexible , trachea midline, no stridor , thyroid nl, carotid no bruit Chest - symmetrical excursion , unlabored           Heart/CV- RRR , no murmur , no gallop  , no rub, nl s1 s2                           - JVD- none , edema- none, stasis changes- none, varices- none           Lung- clear to P&A, wheeze- none, cough- none , dullness-none, rub- none           Chest wall-  Abd-  Br/ Gen/ Rectal- Not done, not indicated Extrem- cyanosis- none, clubbing, none, atrophy- none,  strength- nl Neuro- grossly intact to observation

## 2022-05-14 ENCOUNTER — Ambulatory Visit: Payer: Managed Care, Other (non HMO) | Admitting: Internal Medicine

## 2022-05-18 ENCOUNTER — Ambulatory Visit: Payer: Managed Care, Other (non HMO) | Admitting: Internal Medicine

## 2022-06-28 NOTE — Progress Notes (Deleted)
HPI female never smoker followed for OSA, complicated by HTN, IBS, GERD, Obesity, PCOS,  HST 10/17/20- AHI 72/ hr, desaturation to 81%, body weight 225 lbs  ============================================================     05/15/21- 42 year old female never smoker followed for OSA, complicated by HTN, IBS, GERD, Obesity, PCOS,  CPAP  auto 5-20/ [employer]   ordered 11/22/20 Download-compliance 100%, AHI 0.3/ hr Body weight today-229 lbs Covid vax-4 Phizer Flu vax-had States he "loves" her CPAP machine and says it works very well-does not want to sleep without it.  No longer needing significant caffeine to stay awake.  06/30/22-42 year old female never smoker followed for OSA, complicated by HTN, IBS, GERD, Obesity, PCOS,  CPAP  auto 5-20/ [employer]   ordered 11/22/20 Download-compliance  Body weight today-  ROS-see HPI + = positive Constitutional:    weight loss, night sweats, fevers, chills, fatigue, lassitude. HEENT:    headaches, difficulty swallowing, tooth/dental problems, sore throat,       sneezing, itching, ear ache, nasal congestion, post nasal drip, snoring CV:    chest pain, orthopnea, PND, swelling in lower extremities, anasarca,                                   dizziness, +palpitations Resp:   shortness of breath with exertion or at rest.                productive cough,   non-productive cough, coughing up of blood.              change in color of mucus.  wheezing.   Skin:    rash or lesions. GI:+heartburn, indigestion, abdominal pain, nausea, vomiting, diarrhea,                 change in bowel habits, loss of appetite GU: dysuria, change in color of urine, no urgency or frequency.   flank pain. MS:   joint pain, stiffness, decreased range of motion, back pain. Neuro-     nothing unusual Psych:  change in mood or affect.  depression or +anxiety.   memory loss.   OBJ- Physical Exam General- Alert, Oriented, Affect-appropriate, Distress- none acute, + obese Skin- rash-none,  lesions- none, excoriation- none Lymphadenopathy- none Head- atraumatic            Eyes- Gross vision intact, PERRLA, conjunctivae and secretions clear            Ears- Hearing, canals-normal            Nose- Clear, no-Septal dev, mucus, polyps, erosion, perforation             Throat- Mallampati III, mucosa clear , drainage- none, tonsils+, +teeth Neck- flexible , trachea midline, no stridor , thyroid nl, carotid no bruit Chest - symmetrical excursion , unlabored           Heart/CV- RRR , no murmur , no gallop  , no rub, nl s1 s2                           - JVD- none , edema- none, stasis changes- none, varices- none           Lung- clear to P&A, wheeze- none, cough- none , dullness-none, rub- none           Chest wall-  Abd-  Br/ Gen/ Rectal- Not done, not indicated Extrem- cyanosis- none, clubbing, none, atrophy- none, strength-  nl Neuro- grossly intact to observation

## 2022-06-30 ENCOUNTER — Ambulatory Visit: Payer: Managed Care, Other (non HMO) | Admitting: Internal Medicine

## 2022-07-31 ENCOUNTER — Ambulatory Visit (INDEPENDENT_AMBULATORY_CARE_PROVIDER_SITE_OTHER): Payer: Managed Care, Other (non HMO) | Admitting: Family Medicine

## 2022-07-31 ENCOUNTER — Encounter: Payer: Self-pay | Admitting: Family Medicine

## 2022-07-31 VITALS — BP 112/78 | HR 65 | Temp 97.9°F | Ht 65.75 in | Wt 224.0 lb

## 2022-07-31 DIAGNOSIS — F419 Anxiety disorder, unspecified: Secondary | ICD-10-CM | POA: Diagnosis not present

## 2022-07-31 DIAGNOSIS — R7303 Prediabetes: Secondary | ICD-10-CM | POA: Diagnosis not present

## 2022-07-31 DIAGNOSIS — E7841 Elevated Lipoprotein(a): Secondary | ICD-10-CM

## 2022-07-31 DIAGNOSIS — Z0000 Encounter for general adult medical examination without abnormal findings: Secondary | ICD-10-CM | POA: Diagnosis not present

## 2022-07-31 LAB — CBC WITH DIFFERENTIAL/PLATELET
Basophils Absolute: 52 cells/uL (ref 0–200)
Lymphs Abs: 2671 cells/uL (ref 850–3900)
Monocytes Relative: 6.7 %
Neutro Abs: 4048 cells/uL (ref 1500–7800)
Platelets: 216 10*3/uL (ref 140–400)
RBC: 5.74 10*6/uL — ABNORMAL HIGH (ref 3.80–5.10)
WBC: 7.4 10*3/uL (ref 3.8–10.8)

## 2022-07-31 MED ORDER — ESCITALOPRAM OXALATE 5 MG PO TABS
5.0000 mg | ORAL_TABLET | Freq: Every day | ORAL | 3 refills | Status: DC
Start: 2022-07-31 — End: 2023-08-02

## 2022-07-31 NOTE — Progress Notes (Signed)
Established Patient Office Visit   Subjective  Patient ID: Kristin [name], female    DOB: July 30, 1980  Age: 42 y.o. MRN: [id number]  Chief Complaint  Patient presents with   Annual Exam    Pt is a 42 yo female seen for CPE and f/u.  Doing well without complaint.  Was a little sad last wk due to losing her puppy.  Lexapro working well.  Now taking at night due to causing drowsiness during day.  Needs to call to set up mammogram.  Pt is a Education officer, museum.    Past Medical History:  Diagnosis Date   Anxiety    Dysrhythmia    hx pvc's none recwent per pt   Genital warts    GERD (gastroesophageal reflux disease)    Hyperlipidemia    Hypertension    situational no bp meds ever   IBS (irritable bowel syndrome)    diarrhea prone   PONV (postoperative nausea and vomiting)    after appendix   Past Surgical History:  Procedure Laterality Date   APPENDECTOMY     DILATATION & CURETTAGE/HYSTEROSCOPY WITH MYOSURE N/A 12/21/2018   Procedure: DILATATION & CURETTAGE/HYSTEROSCOPY WITH MYOSURE;  Surgeon: Carrington Clamp, MD;  Location: [facility] [place];  Service: Gynecology;  Laterality: N/A;   LAPAROSCOPIC APPENDECTOMY  10/04/2011   Procedure: APPENDECTOMY LAPAROSCOPIC;  Surgeon: Almond Lint, MD;  Location: [facility];  Service: General;  Laterality: N/A;   WISDOM TOOTH EXTRACTION     Social History   Tobacco Use   Smoking status: Never   Smokeless tobacco: Never  Vaping Use   Vaping status: Never Used  Substance Use Topics   Alcohol use: No   Drug use: No   Family History  Adopted: Yes  Problem Relation Age of Onset   Diabetes Mother    Kidney disease Mother    Colon cancer Neg Hx    Stomach cancer Neg Hx    Esophageal cancer Neg Hx    Rectal cancer Neg Hx    No Active Allergies    ROS Negative unless stated above    Objective:     BP 112/78 (BP Location: Left Arm, Patient Position: Sitting, Cuff Size: Large)   Pulse 65   Temp 97.9 F (36.6 C)  (Oral)   Ht 5' 5.75" (1.67 m)   Wt 224 lb (101.6 kg)   SpO2 98%   BMI 36.43 kg/m    Physical Exam Constitutional:      Appearance: Normal appearance.  HENT:     Head: Normocephalic and atraumatic.     Right Ear: Tympanic membrane, ear canal and external ear normal.     Left Ear: Tympanic membrane, ear canal and external ear normal.     Nose: Nose normal.     Mouth/Throat:     Mouth: Mucous membranes are moist.     Pharynx: No oropharyngeal exudate or posterior oropharyngeal erythema.  Eyes:     General: No scleral icterus.    Extraocular Movements: Extraocular movements intact.     Conjunctiva/sclera: Conjunctivae normal.     Pupils: Pupils are equal, round, and reactive to light.  Neck:     Thyroid: No thyromegaly.  Cardiovascular:     Rate and Rhythm: Normal rate and regular rhythm.     Pulses: Normal pulses.     Heart sounds: Normal heart sounds. No murmur heard.    No friction rub.  Pulmonary:     Effort: Pulmonary effort is normal.  Breath sounds: Normal breath sounds. No wheezing, rhonchi or rales.  Abdominal:     General: Bowel sounds are normal.     Palpations: Abdomen is soft.     Tenderness: There is no abdominal tenderness.  Musculoskeletal:        General: No deformity. Normal range of motion.  Lymphadenopathy:     Cervical: No cervical adenopathy.  Skin:    General: Skin is warm and dry.     Findings: No lesion.  Neurological:     General: No focal deficit present.     Mental Status: She is alert and oriented to person, place, and time.  Psychiatric:        Mood and Affect: Mood normal.        Thought Content: Thought content normal.       07/31/2022   10:45 AM 07/25/2021    8:38 AM 07/22/2020    9:38 AM  Depression screen PHQ 2/9  Decreased Interest 0 0 0  Down, Depressed, Hopeless 1 0 0  PHQ - 2 Score 1 0 0  Altered sleeping 0 1 0  Tired, decreased energy 0 1 0  Change in appetite 1 0 0  Feeling bad or failure about yourself  0 0 0   Trouble concentrating 0 0 0  Moving slowly or fidgety/restless 0 0 0  Suicidal thoughts 0 0 0  PHQ-9 Score 2 2 0  Difficult doing work/chores Somewhat difficult Not difficult at all       07/31/2022   10:45 AM 07/25/2021    8:40 AM 07/22/2020    9:38 AM 07/10/2020    8:18 AM  GAD 7 : Generalized Anxiety Score  Nervous, Anxious, on Edge 1 0 0 0  Control/stop worrying 0 0 0 0  Worry too much - different things 0 0 0 0  Trouble relaxing 1 1 0 0  Restless 0 0 0 0  Easily annoyed or irritable 0 0 0 1  Afraid - awful might happen 0 0 0 0  Total GAD 7 Score 2 1 0 1  Anxiety Difficulty Not difficult at all Not difficult at all       No results found for any visits on 07/31/22.    Assessment & Plan:  Well adult exam -     CBC with Differential/Platelet  Anxiety -     Escitalopram Oxalate; Take 1 tablet (5 mg total) by mouth daily.  Dispense: 90 tablet; Refill: 3 -     CBC with Differential/Platelet -     TSH -     T4, free  Prediabetes -     Hemoglobin A1c  Elevated lipoprotein(a) -     Comprehensive metabolic panel -     Lipid panel  Age appropriate health screenings discussed.  Obtain labs.  Continue lifestyle modifications.  Stable on Lexapro 5 mg.  Return in about 1 year (around 07/31/2023), or if symptoms worsen or fail to improve, for physical.   Deeann Saint, MD

## 2022-08-01 LAB — COMPREHENSIVE METABOLIC PANEL
AG Ratio: 1.6 (calc) (ref 1.0–2.5)
ALT: 7 U/L (ref 6–29)
AST: 10 U/L (ref 10–30)
Albumin: 4.4 g/dL (ref 3.6–5.1)
Alkaline phosphatase (APISO): 84 U/L (ref 31–125)
BUN: 13 mg/dL (ref 7–25)
CO2: 27 mmol/L (ref 20–32)
Calcium: 9.3 mg/dL (ref 8.6–10.2)
Chloride: 107 mmol/L (ref 98–110)
Creat: 0.87 mg/dL (ref 0.50–0.99)
Globulin: 2.7 g/dL (calc) (ref 1.9–3.7)
Glucose, Bld: 87 mg/dL (ref 65–99)
Potassium: 4.7 mmol/L (ref 3.5–5.3)
Sodium: 141 mmol/L (ref 135–146)
Total Bilirubin: 0.4 mg/dL (ref 0.2–1.2)
Total Protein: 7.1 g/dL (ref 6.1–8.1)

## 2022-08-01 LAB — T4, FREE: Free T4: 1 ng/dL (ref 0.8–1.8)

## 2022-08-01 LAB — LIPID PANEL
Cholesterol: 211 mg/dL — ABNORMAL HIGH (ref <200)
HDL: 44 mg/dL — ABNORMAL LOW (ref >=50)
LDL Cholesterol (Calc): 132 mg/dL (calc) — ABNORMAL HIGH
Non-HDL Cholesterol (Calc): 167 mg/dL (calc) — ABNORMAL HIGH (ref <130)
Total CHOL/HDL Ratio: 4.8 (calc) (ref <5.0)
Triglycerides: 201 mg/dL — ABNORMAL HIGH (ref <150)

## 2022-08-01 LAB — CBC WITH DIFFERENTIAL/PLATELET
Absolute Monocytes: 496 cells/uL (ref 200–950)
Basophils Relative: 0.7 %
Eosinophils Absolute: 133 cells/uL (ref 15–500)
Eosinophils Relative: 1.8 %
HCT: 47.2 % — ABNORMAL HIGH (ref 35.0–45.0)
Hemoglobin: 15.3 g/dL (ref 11.7–15.5)
MCH: 26.7 pg — ABNORMAL LOW (ref 27.0–33.0)
MCHC: 32.4 g/dL (ref 32.0–36.0)
MCV: 82.2 fL (ref 80.0–100.0)
MPV: 12.7 fL — ABNORMAL HIGH (ref 7.5–12.5)
Neutrophils Relative %: 54.7 %
RDW: 13.1 % (ref 11.0–15.0)
Total Lymphocyte: 36.1 %

## 2022-08-01 LAB — HEMOGLOBIN A1C
Hgb A1c MFr Bld: 5.8 % of total Hgb — ABNORMAL HIGH (ref <5.7)
Mean Plasma Glucose: 120 mg/dL
eAG (mmol/L): 6.6 mmol/L

## 2022-08-01 LAB — TSH: TSH: 2.77 mIU/L

## 2022-08-10 ENCOUNTER — Ambulatory Visit: Payer: Managed Care, Other (non HMO) | Admitting: Internal Medicine

## 2022-08-18 ENCOUNTER — Encounter: Payer: Self-pay | Admitting: Family Medicine

## 2022-08-18 LAB — HM PAP SMEAR: HM Pap smear: NORMAL

## 2022-08-18 LAB — HM MAMMOGRAPHY: HM Mammogram: NORMAL (ref 0–4)

## 2022-09-23 NOTE — Progress Notes (Signed)
HPI female never smoker followed for OSA, complicated by HTN, IBS, GERD, Obesity, PCOS,  HST 10/17/20- AHI 72/ hr, desaturation to 81%, body weight 225 lbs  ============================================================   05/15/21- 42 year old female never smoker followed for OSA, complicated by HTN, IBS, GERD, Obesity, PCOS,  CPAP  auto 5-20/ [employer]   ordered 11/22/20 Download-compliance 100%, AHI 0.3/ hr Body weight today-229 lbs Covid vax-4 Phizer Flu vax-had States he "loves" her CPAP machine and says it works very well-does not want to sleep without it.  No longer needing significant caffeine to stay awake.  09/24/22- 42 year old female never smoker followed for OSA, complicated by HTN, IBS, GERD, Obesity, PCOS,  CPAP  auto 5-20/ [employer]   ordered 11/22/20 Download-compliance 100%, AHI 0.7/hr Body weight today 225 lbs Download reviewed. Doing well and life is better with CPAP.  ROS-see HPI + = positive Constitutional:    weight loss, night sweats, fevers, chills, fatigue, lassitude. HEENT:    headaches, difficulty swallowing, tooth/dental problems, sore throat,       sneezing, itching, ear ache, nasal congestion, post nasal drip, snoring CV:    chest pain, orthopnea, PND, swelling in lower extremities, anasarca,                                   dizziness, +palpitations Resp:   shortness of breath with exertion or at rest.                productive cough,   non-productive cough, coughing up of blood.              change in color of mucus.  wheezing.   Skin:    rash or lesions. GI:+heartburn, indigestion, abdominal pain, nausea, vomiting, diarrhea,                 change in bowel habits, loss of appetite GU: dysuria, change in color of urine, no urgency or frequency.   flank pain. MS:   joint pain, stiffness, decreased range of motion, back pain. Neuro-     nothing unusual Psych:  change in mood or affect.  depression or +anxiety.   memory loss.   OBJ- Physical Exam General- Alert,  Oriented, Affect-appropriate, Distress- none acute, + obese Skin- ? rosacea Lymphadenopathy- none Head- atraumatic            Eyes- Gross vision intact, PERRLA, conjunctivae and secretions clear            Ears- Hearing, canals-normal            Nose- Clear, no-Septal dev, mucus, polyps, erosion, perforation             Throat- Mallampati III, mucosa clear , drainage- none, tonsils+, +teeth Neck- flexible , trachea midline, no stridor , thyroid nl, carotid no bruit Chest - symmetrical excursion , unlabored           Heart/CV- RRR , no murmur , no gallop  , no rub, nl s1 s2                           - JVD- none , edema- none, stasis changes- none, varices- none           Lung- clear to P&A, wheeze- none, cough- none , dullness-none, rub- none           Chest wall-  Abd-  Br/ Gen/ Rectal- Not done,  not indicated Extrem- cyanosis- none, clubbing, none, atrophy- none, strength- nl Neuro- grossly intact to observation

## 2022-09-24 ENCOUNTER — Encounter: Payer: Self-pay | Admitting: Internal Medicine

## 2022-09-24 ENCOUNTER — Ambulatory Visit (INDEPENDENT_AMBULATORY_CARE_PROVIDER_SITE_OTHER): Payer: Managed Care, Other (non HMO) | Admitting: Internal Medicine

## 2022-09-24 VITALS — BP 118/74 | HR 78 | Ht 65.75 in | Wt 225.0 lb

## 2022-09-24 DIAGNOSIS — Z6835 Body mass index (BMI) 35.0-35.9, adult: Secondary | ICD-10-CM | POA: Diagnosis not present

## 2022-09-24 DIAGNOSIS — E6609 Other obesity due to excess calories: Secondary | ICD-10-CM | POA: Diagnosis not present

## 2022-09-24 DIAGNOSIS — G4733 Obstructive sleep apnea (adult) (pediatric): Secondary | ICD-10-CM

## 2022-09-24 NOTE — Patient Instructions (Signed)
Glad you are doing well.  We can continue CPAP auto 5-20  Please call if we can help 

## 2022-09-27 ENCOUNTER — Encounter: Payer: Self-pay | Admitting: Internal Medicine

## 2022-09-27 NOTE — Assessment & Plan Note (Signed)
Benefits from CPAP with good compliance and control Plan-continue auto 5-20

## 2022-09-27 NOTE — Assessment & Plan Note (Signed)
Encourage life style changes toward diet/ exercise

## 2022-12-08 ENCOUNTER — Other Ambulatory Visit: Payer: Self-pay

## 2023-08-02 ENCOUNTER — Ambulatory Visit (INDEPENDENT_AMBULATORY_CARE_PROVIDER_SITE_OTHER): Payer: Managed Care, Other (non HMO) | Admitting: Family Medicine

## 2023-08-02 ENCOUNTER — Encounter: Payer: Self-pay | Admitting: Family Medicine

## 2023-08-02 VITALS — BP 120/80 | HR 70 | Temp 98.1°F | Ht 65.0 in | Wt 220.0 lb

## 2023-08-02 DIAGNOSIS — E538 Deficiency of other specified B group vitamins: Secondary | ICD-10-CM | POA: Insufficient documentation

## 2023-08-02 DIAGNOSIS — G4733 Obstructive sleep apnea (adult) (pediatric): Secondary | ICD-10-CM

## 2023-08-02 DIAGNOSIS — H6122 Impacted cerumen, left ear: Secondary | ICD-10-CM | POA: Diagnosis not present

## 2023-08-02 DIAGNOSIS — F419 Anxiety disorder, unspecified: Secondary | ICD-10-CM | POA: Diagnosis not present

## 2023-08-02 DIAGNOSIS — Z0000 Encounter for general adult medical examination without abnormal findings: Secondary | ICD-10-CM

## 2023-08-02 DIAGNOSIS — R5383 Other fatigue: Secondary | ICD-10-CM | POA: Diagnosis not present

## 2023-08-02 DIAGNOSIS — R7303 Prediabetes: Secondary | ICD-10-CM | POA: Insufficient documentation

## 2023-08-02 DIAGNOSIS — E782 Mixed hyperlipidemia: Secondary | ICD-10-CM | POA: Diagnosis not present

## 2023-08-02 LAB — CBC WITH DIFFERENTIAL/PLATELET
Basophils Absolute: 0 K/uL (ref 0.0–0.1)
Basophils Relative: 0.6 % (ref 0.0–3.0)
Eosinophils Absolute: 0.1 K/uL (ref 0.0–0.7)
Eosinophils Relative: 1.5 % (ref 0.0–5.0)
HCT: 44.9 % (ref 36.0–46.0)
Hemoglobin: 14.8 g/dL (ref 12.0–15.0)
Lymphocytes Relative: 33.4 % (ref 12.0–46.0)
Lymphs Abs: 2 K/uL (ref 0.7–4.0)
MCHC: 33 g/dL (ref 30.0–36.0)
MCV: 79.7 fl (ref 78.0–100.0)
Monocytes Absolute: 0.4 K/uL (ref 0.1–1.0)
Monocytes Relative: 6 % (ref 3.0–12.0)
Neutro Abs: 3.5 K/uL (ref 1.4–7.7)
Neutrophils Relative %: 58.5 % (ref 43.0–77.0)
Platelets: 197 K/uL (ref 150.0–400.0)
RBC: 5.63 Mil/uL — ABNORMAL HIGH (ref 3.87–5.11)
RDW: 13.5 % (ref 11.5–15.5)
WBC: 5.9 K/uL (ref 4.0–10.5)

## 2023-08-02 LAB — LIPID PANEL
Cholesterol: 234 mg/dL — ABNORMAL HIGH (ref 0–200)
HDL: 37 mg/dL — ABNORMAL LOW (ref >39.00)
LDL Cholesterol: 145 mg/dL — ABNORMAL HIGH (ref 0–99)
NonHDL: 197.01
Total CHOL/HDL Ratio: 6
Triglycerides: 262 mg/dL — ABNORMAL HIGH (ref 0.0–149.0)
VLDL: 52.4 mg/dL — ABNORMAL HIGH (ref 0.0–40.0)

## 2023-08-02 LAB — COMPREHENSIVE METABOLIC PANEL WITH GFR
ALT: 11 U/L (ref 0–35)
AST: 16 U/L (ref 0–37)
Albumin: 4.4 g/dL (ref 3.5–5.2)
Alkaline Phosphatase: 73 U/L (ref 39–117)
BUN: 12 mg/dL (ref 6–23)
CO2: 26 meq/L (ref 19–32)
Calcium: 9.3 mg/dL (ref 8.4–10.5)
Chloride: 106 meq/L (ref 96–112)
Creatinine, Ser: 0.96 mg/dL (ref 0.40–1.20)
GFR: 73.11 mL/min (ref >60.00)
Glucose, Bld: 104 mg/dL — ABNORMAL HIGH (ref 70–99)
Potassium: 4.6 meq/L (ref 3.5–5.1)
Sodium: 140 meq/L (ref 135–145)
Total Bilirubin: 0.6 mg/dL (ref 0.2–1.2)
Total Protein: 7.1 g/dL (ref 6.0–8.3)

## 2023-08-02 LAB — T4, FREE: Free T4: 0.66 ng/dL (ref 0.60–1.60)

## 2023-08-02 LAB — TSH: TSH: 2.38 u[IU]/mL (ref 0.35–5.50)

## 2023-08-02 LAB — VITAMIN D 25 HYDROXY (VIT D DEFICIENCY, FRACTURES): VITD: 27.58 ng/mL — ABNORMAL LOW (ref 30.00–100.00)

## 2023-08-02 LAB — HEMOGLOBIN A1C: Hgb A1c MFr Bld: 6 % (ref 4.6–6.5)

## 2023-08-02 LAB — VITAMIN B12: Vitamin B-12: 255 pg/mL (ref 211–911)

## 2023-08-02 MED ORDER — ESCITALOPRAM OXALATE 5 MG PO TABS: 5.0000 mg | ORAL_TABLET | Freq: Every day | ORAL | 3 refills | Status: AC

## 2023-08-02 NOTE — Patient Instructions (Addendum)
 Next physical in 1 year.  Follow-up in 3 to 6 months if needed for worsened fatigue, irritability, etc.

## 2023-08-02 NOTE — Progress Notes (Signed)
 Established Patient Office Visit   Subjective  Patient ID: Kristin [name], female    DOB: 02-03-1980  Age: 43 y.o. MRN: [id number]  Chief Complaint  Patient presents with   Annual Exam    Pt is fasting and would like a refill on Lexapro .    Pt is a 43 year old female seen for CPE.  Doing well overall.  Has noticed decreased energy/fatigue.  Wearing CPAP nightly.  Unsure if is related to travel for work or if vitamins are low.  Patient has noticed occasional mood swings/being more snappy.  Taking Lexapro  5 mg daily.  Requesting refill.   hair may be shedding more.  Noticing more aches and pains and occasional hot flashes.  Left ear with cerumen on exam.  States likely 2/2 use of ear plugs at night while traveling.    Patient Active Problem List   Diagnosis Date Noted   Diarrhea 09/05/2021   Generalized abdominal pain 09/05/2021   OSA (obstructive sleep apnea) 07/22/2020   Encounter for general adult medical examination with abnormal findings 07/04/2017   Obese    IBS (irritable bowel syndrome)    GERD (gastroesophageal reflux disease)    Anxiety    Past Medical History:  Diagnosis Date   Anxiety    Dysrhythmia    hx pvc's none recwent per pt   Genital warts    GERD (gastroesophageal reflux disease)    Hyperlipidemia    Hypertension    situational no bp meds ever   IBS (irritable bowel syndrome)    diarrhea prone   PONV (postoperative nausea and vomiting)    after appendix   Past Surgical History:  Procedure Laterality Date   APPENDECTOMY     DILATATION & CURETTAGE/HYSTEROSCOPY WITH MYOSURE N/A 12/21/2018   Procedure: DILATATION & CURETTAGE/HYSTEROSCOPY WITH MYOSURE;  Surgeon: Sarrah Browning, MD;  Location: [facility];  Service: Gynecology;  Laterality: N/A;   LAPAROSCOPIC APPENDECTOMY  10/04/2011   Procedure: APPENDECTOMY LAPAROSCOPIC;  Surgeon: Jina Nephew, MD;  Location: [facility];  Service: General;  Laterality: N/A;   WISDOM TOOTH EXTRACTION      Social History   Tobacco Use   Smoking status: Never   Smokeless tobacco: Never  Vaping Use   Vaping status: Never Used  Substance Use Topics   Alcohol use: No   Drug use: No   Family History  Adopted: Yes  Problem Relation Age of Onset   Diabetes Mother    Kidney disease Mother    Colon cancer Neg Hx    Stomach cancer Neg Hx    Esophageal cancer Neg Hx    Rectal cancer Neg Hx    No Known Allergies  ROS Negative unless stated above    Objective:     BP 120/80 (BP Location: Left Arm, Patient Position: Sitting, Cuff Size: Large)   Pulse 70   Temp 98.1 F (36.7 C) (Oral)   Ht 5' 5 (1.651 m)   Wt 220 lb (99.8 kg)   LMP 06/08/2023 (Approximate)   SpO2 98%   BMI 36.61 kg/m  BP Readings from Last 3 Encounters:  08/02/23 120/80  09/24/22 118/74  07/31/22 112/78   Wt Readings from Last 3 Encounters:  08/02/23 220 lb (99.8 kg)  09/24/22 225 lb (102.1 kg)  07/31/22 224 lb (101.6 kg)      Physical Exam Constitutional:      Appearance: Normal appearance.  HENT:     Head: Normocephalic and atraumatic.     Right Ear: Hearing,  tympanic membrane, ear canal and external ear normal.     Left Ear: Hearing, tympanic membrane, ear canal and external ear normal. There is impacted cerumen.     Ears:     Comments: Scant dry skin and moderate cerumen impaction left canal.    Nose: Nose normal.     Mouth/Throat:     Mouth: Mucous membranes are moist.     Pharynx: Oropharynx is clear. No oropharyngeal exudate or posterior oropharyngeal erythema.     Tonsils: 0 on the right. 1+ on the left.  Eyes:     General: No scleral icterus.    Extraocular Movements: Extraocular movements intact.     Conjunctiva/sclera: Conjunctivae normal.     Pupils: Pupils are equal, round, and reactive to light.  Neck:     Thyroid : No thyromegaly or thyroid  tenderness.     Vascular: No carotid bruit.  Cardiovascular:     Rate and Rhythm: Normal rate and regular rhythm.     Pulses: Normal  pulses.     Heart sounds: Normal heart sounds. No murmur heard.    No friction rub.  Pulmonary:     Effort: Pulmonary effort is normal.     Breath sounds: Normal breath sounds. No wheezing, rhonchi or rales.  Abdominal:     General: Bowel sounds are normal.     Palpations: Abdomen is soft.     Tenderness: There is no abdominal tenderness.  Musculoskeletal:        General: No deformity. Normal range of motion.  Lymphadenopathy:     Cervical: No cervical adenopathy.  Skin:    General: Skin is warm and dry.     Findings: No lesion.  Neurological:     General: No focal deficit present.     Mental Status: She is alert and oriented to person, place, and time.  Psychiatric:        Mood and Affect: Mood normal.        Thought Content: Thought content normal.        08/02/2023    8:06 AM 07/31/2022   10:45 AM 07/25/2021    8:38 AM  Depression screen PHQ 2/9  Decreased Interest 0 0 0  Down, Depressed, Hopeless 0 1 0  PHQ - 2 Score 0 1 0  Altered sleeping  0 1  Tired, decreased energy  0 1  Change in appetite  1 0  Feeling bad or failure about yourself   0 0  Trouble concentrating  0 0  Moving slowly or fidgety/restless  0 0  Suicidal thoughts  0 0  PHQ-9 Score  2 2  Difficult doing work/chores  Somewhat difficult Not difficult at all      07/31/2022   10:45 AM 07/25/2021    8:40 AM 07/22/2020    9:38 AM 07/10/2020    8:18 AM  GAD 7 : Generalized Anxiety Score  Nervous, Anxious, on Edge 1 0 0 0  Control/stop worrying 0 0 0 0  Worry too much - different things 0 0 0 0  Trouble relaxing 1 1 0 0  Restless 0 0 0 0  Easily annoyed or irritable 0 0 0 1  Afraid - awful might happen 0 0 0 0  Total GAD 7 Score 2 1 0 1  Anxiety Difficulty Not difficult at all Not difficult at all       No results found for any visits on 08/02/23.    Assessment & Plan:   Well adult exam -  CBC with Differential/Platelet; Future -     Comprehensive metabolic panel with GFR; Future -      Hemoglobin A1c; Future -     Lipid panel; Future -     T4, free; Future -     TSH; Future  Anxiety -     T4, free; Future -     TSH; Future -     Escitalopram  Oxalate; Take 1 tablet (5 mg total) by mouth daily.  Dispense: 90 tablet; Refill: 3  Prediabetes -     Hemoglobin A1c; Future  Mixed hyperlipidemia -     Comprehensive metabolic panel with GFR; Future -     Lipid panel; Future  B12 deficiency -     Vitamin B12; Future  Impacted cerumen of left ear  Fatigue, unspecified type -     CBC with Differential/Platelet; Future -     T4, free; Future -     TSH; Future -     Vitamin B12; Future -     VITAMIN D  25 Hydroxy (Vit-D Deficiency, Fractures); Future  OSA on CPAP  Age-appropriate health screenings discussed.  Will obtain labs.  Immunizations reviewed and up-to-date.  Pap done 08/18/2022.  Repeat 2029.  Colonoscopy done 09/18/2021.  Mammogram scheduled in a few weeks as last done 08/18/2022.  Discussed the importance of lifestyle modifications and leading diet changes.  Next CPE in 1 year.  Fatigue possibly multifactorial including frequent travel, vitamin B12 or other vitamin deficiency, also consider thyroid  dysfunction or perimenopausal symptoms.  Perimenopause could also be contributing to symptoms given increased fatigue, hair shedding, aches and pains, hot flashes.  Will obtain labs.  Continue Lexapro  5 mg daily.  PHQ 2 score 0, GAD-7 score 2 this visit.  Advised can go up on dose of Lexapro  if needed.  Patient declines at this time.  Consent obtained.  Left ear irrigated.  Patient tolerated procedure well.  TM normal after irrigation.  Continue CPAP.  Return if symptoms worsen or fail to improve.  Neck CPE in 1 year.  Follow-up in 3-6 months or sooner if needed for continued or worsened fatigue, irritability, etc.  Clotilda JONELLE Single, MD

## 2023-08-04 ENCOUNTER — Other Ambulatory Visit: Payer: Self-pay | Admitting: Medical Genetics

## 2023-08-20 ENCOUNTER — Other Ambulatory Visit (HOSPITAL_COMMUNITY)
Admission: RE | Admit: 2023-08-20 | Discharge: 2023-08-20 | Disposition: A | Discharge status: Home / Self Care | Payer: Self-pay | Source: Ambulatory Visit | Attending: Medical Genetics | Admitting: Medical Genetics

## 2023-08-20 ENCOUNTER — Encounter: Admitting: Family Medicine

## 2023-08-21 ENCOUNTER — Ambulatory Visit: Payer: Self-pay | Admitting: Family Medicine

## 2023-08-21 DIAGNOSIS — E559 Vitamin D deficiency, unspecified: Secondary | ICD-10-CM

## 2023-08-21 MED ORDER — VITAMIN D (ERGOCALCIFEROL) 1.25 MG (50000 UNIT) PO CAPS
50000.0000 [IU] | ORAL_CAPSULE | ORAL | 0 refills | Status: AC
  Filled 2023-08-21: qty 4, 28d supply, fill #0
  Filled 2023-09-16: qty 4, 28d supply, fill #1
  Filled 2023-10-18: qty 4, 28d supply, fill #2

## 2023-08-23 ENCOUNTER — Other Ambulatory Visit (HOSPITAL_COMMUNITY): Payer: Self-pay

## 2023-08-31 LAB — GENECONNECT MOLECULAR SCREEN: Genetic Analysis Overall Interpretation: NEGATIVE

## 2023-09-30 ENCOUNTER — Other Ambulatory Visit (HOSPITAL_BASED_OUTPATIENT_CLINIC_OR_DEPARTMENT_OTHER): Payer: Self-pay

## 2023-10-18 ENCOUNTER — Other Ambulatory Visit (HOSPITAL_COMMUNITY): Payer: Self-pay

## 2023-12-03 ENCOUNTER — Encounter: Payer: Self-pay | Admitting: Family Medicine

## 2023-12-03 ENCOUNTER — Ambulatory Visit: Admitting: Family Medicine

## 2023-12-03 VITALS — BP 136/88 | HR 77 | Temp 98.6°F | Wt 223.6 lb

## 2023-12-03 DIAGNOSIS — R946 Abnormal results of thyroid function studies: Secondary | ICD-10-CM

## 2023-12-03 NOTE — Progress Notes (Signed)
 Established Patient Office Visit   Subjective  Patient ID: Kristin [name], female    DOB: 04/15/1980  Age: 43 y.o. MRN: [id number]  Chief Complaint  Patient presents with   Medical Management of Chronic Issues    Patient came in for a follow-up after [facility] screening where they told her she has an enlarged thyroid .  Patient has had recent TSH and T4 labs, numbers in mychart.    Patient is a 43 year old female seen for follow-up.  Patient endorses being told that her thyroid  was enlarged during [facility] screening u/s of carotids.  Pt denies pain, dysphagia, enlargement/fullness in neck, unsure on family h/o thyroid  d/o as adopted.  Per chart review free T4 and TSH normal in July.    Patient Active Problem List   Diagnosis Date Noted   B12 deficiency 08/02/2023   Mixed hyperlipidemia 08/02/2023   Prediabetes 08/02/2023   Diarrhea 09/05/2021   Generalized abdominal pain 09/05/2021   OSA on CPAP 07/22/2020   Encounter for general adult medical examination with abnormal findings 07/04/2017   Obese    IBS (irritable bowel syndrome)    GERD (gastroesophageal reflux disease)    Anxiety    Past Medical History:  Diagnosis Date   Anxiety    Dysrhythmia    hx pvc's none recwent per pt   Genital warts    GERD (gastroesophageal reflux disease)    Hyperlipidemia    Hypertension    situational no bp meds ever   IBS (irritable bowel syndrome)    diarrhea prone   PONV (postoperative nausea and vomiting)    after appendix   Past Surgical History:  Procedure Laterality Date   APPENDECTOMY     DILATATION & CURETTAGE/HYSTEROSCOPY WITH MYOSURE N/A 12/21/2018   Procedure: DILATATION & CURETTAGE/HYSTEROSCOPY WITH MYOSURE;  Surgeon: Sarrah Browning, MD;  Location: [facility] [place];  Service: Gynecology;  Laterality: N/A;   LAPAROSCOPIC APPENDECTOMY  10/04/2011   Procedure: APPENDECTOMY LAPAROSCOPIC;  Surgeon: Jina Nephew, MD;  Location: [facility];  Service: General;   Laterality: N/A;   WISDOM TOOTH EXTRACTION     Social History   Tobacco Use   Smoking status: Never   Smokeless tobacco: Never  Vaping Use   Vaping status: Never Used  Substance Use Topics   Alcohol use: No   Drug use: No   Family History  Adopted: Yes  Problem Relation Age of Onset   Diabetes Mother    Kidney disease Mother    Colon cancer Neg Hx    Stomach cancer Neg Hx    Esophageal cancer Neg Hx    Rectal cancer Neg Hx    No Known Allergies  ROS Negative unless stated above    Objective:     BP 136/88 (BP Location: Left Arm, Patient Position: Sitting, Cuff Size: Large)   Pulse 77   Temp 98.6 F (37 C) (Oral)   Wt 223 lb 9.6 oz (101.4 kg)   LMP 11/18/2023 (Exact Date)   SpO2 99%   BMI 37.21 kg/m  BP Readings from Last 3 Encounters:  12/03/23 136/88  08/02/23 120/80  09/24/22 118/74   Wt Readings from Last 3 Encounters:  12/03/23 223 lb 9.6 oz (101.4 kg)  08/02/23 220 lb (99.8 kg)  09/24/22 225 lb (102.1 kg)      Physical Exam Constitutional:      General: She is not in acute distress.    Appearance: Normal appearance.  HENT:     Head: Normocephalic and atraumatic.  Nose: Nose normal.     Mouth/Throat:     Mouth: Mucous membranes are moist.  Neck:     Thyroid : No thyroid  mass, thyromegaly or thyroid  tenderness.  Cardiovascular:     Rate and Rhythm: Normal rate and regular rhythm.     Heart sounds: Normal heart sounds. No murmur heard.    No gallop.  Pulmonary:     Effort: Pulmonary effort is normal. No respiratory distress.     Breath sounds: Normal breath sounds. No wheezing, rhonchi or rales.  Musculoskeletal:     Cervical back: Normal range of motion.  Skin:    General: Skin is warm and dry.  Neurological:     Mental Status: She is alert and oriented to person, place, and time.        12/03/2023   11:05 AM 08/02/2023    8:06 AM 07/31/2022   10:45 AM  Depression screen PHQ 2/9  Decreased Interest 0 0 0  Down, Depressed,  Hopeless 0 0 1  PHQ - 2 Score 0 0 1  Altered sleeping 0  0  Tired, decreased energy 1  0  Change in appetite 0  1  Feeling bad or failure about yourself  0  0  Trouble concentrating 0  0  Moving slowly or fidgety/restless 0  0  Suicidal thoughts 0  0  PHQ-9 Score 1  2   Difficult doing work/chores Not difficult at all  Somewhat difficult     Data saved with a previous flowsheet row definition      12/03/2023   11:05 AM 07/31/2022   10:45 AM 07/25/2021    8:40 AM 07/22/2020    9:38 AM  GAD 7 : Generalized Anxiety Score  Nervous, Anxious, on Edge 0 1 0 0  Control/stop worrying 0 0 0 0  Worry too much - different things 0 0 0 0  Trouble relaxing 0 1 1 0  Restless 0 0 0 0  Easily annoyed or irritable 0 0 0 0  Afraid - awful might happen 0 0 0 0  Total GAD 7 Score 0 2 1 0  Anxiety Difficulty Not difficult at all Not difficult at all Not difficult at all      No results found for any visits on 12/03/23.    Assessment & Plan:   Abnormal thyroid  exam -     US  THYROID ; Future  Concern for thyroid  enlargement during [facility] screening.  Patient asymptomatic.  Exam normal.  TSH, free T4 normal on 08/02/2023 during CPE.  No prior history of abnormal thyroid  testing or exam.  Will obtain thyroid  ultrasound for further evaluation/peace of mind for pt.  Further recommendations if needed based on results.  Return if symptoms worsen or fail to improve.   Clotilda JONELLE Single, MD

## 2023-12-16 ENCOUNTER — Ambulatory Visit
Admission: RE | Admit: 2023-12-16 | Discharge: 2023-12-16 | Disposition: A | Source: Ambulatory Visit | Attending: Family Medicine

## 2023-12-16 DIAGNOSIS — R946 Abnormal results of thyroid function studies: Secondary | ICD-10-CM

## 2023-12-17 ENCOUNTER — Ambulatory Visit: Payer: Self-pay | Admitting: Family Medicine

## 2024-01-28 ENCOUNTER — Ambulatory Visit: Admitting: Family Medicine

## 2024-01-28 ENCOUNTER — Other Ambulatory Visit (HOSPITAL_COMMUNITY): Payer: Self-pay

## 2024-01-28 ENCOUNTER — Encounter: Payer: Self-pay | Admitting: Family Medicine

## 2024-01-28 VITALS — BP 110/86 | HR 66 | Temp 97.6°F | Ht 65.0 in | Wt 219.0 lb

## 2024-01-28 DIAGNOSIS — F411 Generalized anxiety disorder: Secondary | ICD-10-CM | POA: Diagnosis not present

## 2024-01-28 MED ORDER — HYDROXYZINE HCL 25 MG PO TABS
25.0000 mg | ORAL_TABLET | Freq: Three times a day (TID) | ORAL | 0 refills | Status: AC | PRN
  Filled 2024-01-28: qty 90, 30d supply, fill #0

## 2024-01-28 NOTE — Progress Notes (Signed)
 "  Established Patient Office Visit   Subjective  Patient ID: Kristin [name], female    DOB: 02-13-1980  Age: 44 y.o. MRN: [id number]  Chief Complaint  Patient presents with   Medical Management of Chronic Issues    Anxiety, medication follow-up, patient tapered off lexpro and is having  a lot of Anxiety at this time    Patient is a 44 year old female seen for follow-up on anxiety.  Patient states she was doing well on Lexapro  5 mg daily so she decided to wean off medication.  Stop taking medication late December.  Initially noted increased energy, anxiety, brain fog, tinnitus, intermittent insomnia.  Tried OTC melatonin and L-theanine for insomnia without improvement.  Increased anxiety has remained and is interfering with daily activities as well as making it difficult to sleep.    Patient Active Problem List   Diagnosis Date Noted   B12 deficiency 08/02/2023   Mixed hyperlipidemia 08/02/2023   Prediabetes 08/02/2023   Diarrhea 09/05/2021   Generalized abdominal pain 09/05/2021   OSA on CPAP 07/22/2020   Encounter for general adult medical examination with abnormal findings 07/04/2017   Obese    IBS (irritable bowel syndrome)    GERD (gastroesophageal reflux disease)    Anxiety    Past Medical History:  Diagnosis Date   Anxiety    Dysrhythmia    hx pvc's none recwent per pt   Genital warts    GERD (gastroesophageal reflux disease)    Hyperlipidemia    Hypertension    situational no bp meds ever   IBS (irritable bowel syndrome)    diarrhea prone   PONV (postoperative nausea and vomiting)    after appendix   Past Surgical History:  Procedure Laterality Date   APPENDECTOMY     DILATATION & CURETTAGE/HYSTEROSCOPY WITH MYOSURE N/A 12/21/2018   Procedure: DILATATION & CURETTAGE/HYSTEROSCOPY WITH MYOSURE;  Surgeon: Sarrah Browning, MD;  Location: [facility];  Service: Gynecology;  Laterality: N/A;   LAPAROSCOPIC APPENDECTOMY  10/04/2011   Procedure:  APPENDECTOMY LAPAROSCOPIC;  Surgeon: Jina Nephew, MD;  Location: [facility];  Service: General;  Laterality: N/A;   WISDOM TOOTH EXTRACTION     Social History[1] Family History  Adopted: Yes  Problem Relation Age of Onset   Diabetes Mother    Kidney disease Mother    Colon cancer Neg Hx    Stomach cancer Neg Hx    Esophageal cancer Neg Hx    Rectal cancer Neg Hx    Allergies[2]  ROS Negative unless stated above    Objective:     BP 110/86 (BP Location: Left Arm, Patient Position: Sitting, Cuff Size: Large)   Pulse 66   Temp 97.6 F (36.4 C) (Oral)   Ht 5' 5 (1.651 m)   Wt 219 lb (99.3 kg)   SpO2 99%   BMI 36.44 kg/m  BP Readings from Last 3 Encounters:  01/28/24 110/86  12/03/23 136/88  08/02/23 120/80   Wt Readings from Last 3 Encounters:  01/28/24 219 lb (99.3 kg)  12/03/23 223 lb 9.6 oz (101.4 kg)  08/02/23 220 lb (99.8 kg)      Physical Exam Constitutional:      Appearance: Normal appearance.  HENT:     Head: Normocephalic and atraumatic.     Nose: Nose normal.     Mouth/Throat:     Mouth: Mucous membranes are moist.  Cardiovascular:     Rate and Rhythm: Normal rate and regular rhythm.  Pulmonary:  Effort: Pulmonary effort is normal.  Skin:    General: Skin is warm and dry.  Neurological:     Mental Status: She is alert and oriented to person, place, and time.  Psychiatric:        Mood and Affect: Mood is anxious.        01/28/2024    2:22 PM 12/03/2023   11:05 AM 08/02/2023    8:06 AM  Depression screen PHQ 2/9  Decreased Interest 0 0 0  Down, Depressed, Hopeless 0 0 0  PHQ - 2 Score 0 0 0  Altered sleeping 3 0   Tired, decreased energy 0 1   Change in appetite 1 0   Feeling bad or failure about yourself  0 0   Trouble concentrating 1 0   Moving slowly or fidgety/restless 0 0   Suicidal thoughts 0 0   PHQ-9 Score 5 1   Difficult doing work/chores Very difficult Not difficult at all       01/28/2024    2:22 PM 12/03/2023   11:05  AM 07/31/2022   10:45 AM 07/25/2021    8:40 AM  GAD 7 : Generalized Anxiety Score  Nervous, Anxious, on Edge 3 0 1 0  Control/stop worrying 3 0 0 0  Worry too much - different things 3 0 0 0  Trouble relaxing 3 0 1 1  Restless 3 0 0 0  Easily annoyed or irritable 3 0 0 0  Afraid - awful might happen 3 0 0 0  Total GAD 7 Score 21 0 2 1  Anxiety Difficulty Very difficult Not difficult at all Not difficult at all Not difficult at all     No results found for any visits on 01/28/24.    Assessment & Plan:   GAD (generalized anxiety disorder) -     hydrOXYzine  HCl; Take 1 tablet (25 mg total) by mouth 3 (three) times daily as needed.  Dispense: 90 tablet; Refill: 0   Increased anxiety s/p weaning lexapro  5 mg.  GAD-7 score 21, PHQ-9 score 5.  Withdrawal symptoms largely resolved.  Will start hydroxyzine  12.5-25 mg as needed.  Discussed likely duration of symptoms as patient was on Lexapro  for an extended period of time.  For continued worsening symptoms consider starting a different antianxiety/depression medication.  Notify clinic for continued or worsening symptoms.   Return in about 4 weeks (around 02/25/2024), or if symptoms worsen or fail to improve.   Clotilda JONELLE Single, MD     [1]  Social History Tobacco Use   Smoking status: Never   Smokeless tobacco: Never  Vaping Use   Vaping status: Never Used  Substance Use Topics   Alcohol use: No   Drug use: No  [2] No Known Allergies  "
